# Patient Record
Sex: Female | Born: 1970
Health system: Southern US, Community
[De-identification: ages and names within clinical notes are randomized; demographics above are authoritative.]

## PROBLEM LIST (undated history)

## (undated) DIAGNOSIS — L089 Local infection of the skin and subcutaneous tissue, unspecified: Secondary | ICD-10-CM

## (undated) DIAGNOSIS — Z8249 Family history of ischemic heart disease and other diseases of the circulatory system: Secondary | ICD-10-CM

## (undated) DIAGNOSIS — R413 Other amnesia: Secondary | ICD-10-CM

## (undated) DIAGNOSIS — R079 Chest pain, unspecified: Secondary | ICD-10-CM

## (undated) DIAGNOSIS — I10 Essential (primary) hypertension: Secondary | ICD-10-CM

## (undated) DIAGNOSIS — E785 Hyperlipidemia, unspecified: Secondary | ICD-10-CM

## (undated) HISTORY — DX: Hyperlipidemia, unspecified: E78.5

## (undated) HISTORY — PX: TUBAL LIGATION: SHX77

## (undated) HISTORY — DX: Essential (primary) hypertension: I10

## (undated) HISTORY — PX: APPENDECTOMY: SHX54

## (undated) HISTORY — DX: Other amnesia: R41.3

## (undated) HISTORY — DX: Chest pain, unspecified: R07.9

## (undated) HISTORY — PX: BREAST SURGERY: SHX581

## (undated) HISTORY — PX: FRACTURE SURGERY: SHX138

## (undated) HISTORY — PX: MANDIBLE FRACTURE SURGERY: SHX706

## (undated) HISTORY — DX: Family history of ischemic heart disease and other diseases of the circulatory system: Z82.49

---

## 1998-09-03 ENCOUNTER — Other Ambulatory Visit: Admission: RE | Admit: 1998-09-03 | Discharge: 1998-09-03 | Payer: Self-pay | Admitting: Obstetrics & Gynecology

## 2005-01-12 ENCOUNTER — Ambulatory Visit: Payer: Self-pay | Admitting: Family Medicine

## 2005-03-02 ENCOUNTER — Other Ambulatory Visit: Admission: RE | Admit: 2005-03-02 | Discharge: 2005-03-02 | Payer: Self-pay | Admitting: Obstetrics & Gynecology

## 2007-01-23 ENCOUNTER — Ambulatory Visit: Payer: Self-pay | Admitting: Cardiology

## 2010-04-28 ENCOUNTER — Encounter: Admission: RE | Admit: 2010-04-28 | Discharge: 2010-04-28 | Payer: Self-pay | Admitting: Orthopedic Surgery

## 2010-07-03 ENCOUNTER — Encounter
Admission: RE | Admit: 2010-07-03 | Discharge: 2010-07-26 | Payer: Self-pay | Source: Home / Self Care | Attending: Orthopedic Surgery | Admitting: Orthopedic Surgery

## 2010-07-27 ENCOUNTER — Encounter
Admission: RE | Admit: 2010-07-27 | Discharge: 2010-08-26 | Payer: Self-pay | Source: Home / Self Care | Attending: Orthopedic Surgery | Admitting: Orthopedic Surgery

## 2010-08-01 ENCOUNTER — Ambulatory Visit (HOSPITAL_COMMUNITY)
Admission: RE | Admit: 2010-08-01 | Discharge: 2010-08-01 | Payer: Self-pay | Source: Home / Self Care | Attending: Obstetrics & Gynecology | Admitting: Obstetrics & Gynecology

## 2010-08-01 LAB — PREGNANCY, URINE: Preg Test, Ur: NEGATIVE

## 2010-08-28 ENCOUNTER — Encounter: Payer: Self-pay | Admitting: Physical Therapy

## 2010-10-06 LAB — CBC
HCT: 38.5 % (ref 36.0–46.0)
Hemoglobin: 13.1 g/dL (ref 12.0–15.0)
MCH: 30.4 pg (ref 26.0–34.0)
MCHC: 34 g/dL (ref 30.0–36.0)
MCV: 89.3 fL (ref 78.0–100.0)
Platelets: 221 10*3/uL (ref 150–400)
RBC: 4.31 MIL/uL (ref 3.87–5.11)
RDW: 12.5 % (ref 11.5–15.5)
WBC: 7.5 10*3/uL (ref 4.0–10.5)

## 2011-10-30 ENCOUNTER — Other Ambulatory Visit: Payer: Self-pay | Admitting: Obstetrics & Gynecology

## 2011-10-30 DIAGNOSIS — R928 Other abnormal and inconclusive findings on diagnostic imaging of breast: Secondary | ICD-10-CM

## 2011-11-03 ENCOUNTER — Ambulatory Visit
Admission: RE | Admit: 2011-11-03 | Discharge: 2011-11-03 | Disposition: A | Discharge status: Home / Self Care | Payer: BC Managed Care – PPO | Source: Ambulatory Visit | Attending: Obstetrics & Gynecology | Admitting: Obstetrics & Gynecology

## 2011-11-03 ENCOUNTER — Other Ambulatory Visit: Payer: Self-pay | Admitting: Obstetrics & Gynecology

## 2011-11-03 DIAGNOSIS — R928 Other abnormal and inconclusive findings on diagnostic imaging of breast: Secondary | ICD-10-CM

## 2012-05-10 ENCOUNTER — Other Ambulatory Visit: Payer: Self-pay | Admitting: Obstetrics & Gynecology

## 2012-05-10 DIAGNOSIS — R921 Mammographic calcification found on diagnostic imaging of breast: Secondary | ICD-10-CM

## 2012-05-24 ENCOUNTER — Ambulatory Visit
Admission: RE | Admit: 2012-05-24 | Discharge: 2012-05-24 | Disposition: A | Discharge status: Home / Self Care | Payer: BC Managed Care – PPO | Source: Ambulatory Visit | Attending: Obstetrics & Gynecology | Admitting: Obstetrics & Gynecology

## 2012-05-24 DIAGNOSIS — R921 Mammographic calcification found on diagnostic imaging of breast: Secondary | ICD-10-CM

## 2012-11-16 ENCOUNTER — Other Ambulatory Visit: Payer: Self-pay

## 2013-09-13 ENCOUNTER — Telehealth: Payer: Self-pay | Admitting: Nurse Practitioner

## 2013-09-13 NOTE — Telephone Encounter (Signed)
APPT MADE FOR RASH

## 2013-09-14 ENCOUNTER — Encounter: Payer: Self-pay | Admitting: Family Medicine

## 2013-09-14 ENCOUNTER — Ambulatory Visit (INDEPENDENT_AMBULATORY_CARE_PROVIDER_SITE_OTHER): Payer: BC Managed Care – PPO | Admitting: Family Medicine

## 2013-09-14 ENCOUNTER — Encounter (INDEPENDENT_AMBULATORY_CARE_PROVIDER_SITE_OTHER): Payer: Self-pay

## 2013-09-14 VITALS — BP 115/70 | HR 63 | Temp 99.0°F | Ht 64.25 in | Wt 144.0 lb

## 2013-09-14 DIAGNOSIS — R21 Rash and other nonspecific skin eruption: Secondary | ICD-10-CM

## 2013-09-14 MED ORDER — HYDROXYZINE HCL 25 MG PO TABS: 25.0000 mg | ORAL_TABLET | Freq: Three times a day (TID) | ORAL | Status: DC | PRN

## 2013-09-14 MED ORDER — METHYLPREDNISOLONE ACETATE 80 MG/ML IJ SUSP
80.0000 mg | Freq: Once | INTRAMUSCULAR | Status: AC
  Administered 2013-09-14: 80 mg via INTRAMUSCULAR

## 2013-09-14 NOTE — Patient Instructions (Signed)
Methylprednisolone Suspension for Injection What is this medicine? METHYLPREDNISOLONE (meth ill pred NISS oh lone) is a corticosteroid. It is commonly used to treat inflammation of the skin, joints, lungs, and other organs. Common conditions treated include asthma, allergies, and arthritis. It is also used for other conditions, such as blood disorders and diseases of the adrenal glands. This medicine may be used for other purposes; ask your health care provider or pharmacist if you have questions. COMMON BRAND NAME(S): Depmedalone-40, Depmedalone-80 , Depo-Medrol What should I tell my health care provider before I take this medicine? They need to know if you have any of these conditions: -cataracts or glaucoma -Cushings -heart disease -high blood pressure -infection including tuberculosis -low calcium or potassium levels in the blood -recent surgery -seizures -stomach or intestinal disease, including colitis -threadworms -thyroid problems -an unusual or allergic reaction to methylprednisolone, corticosteroids, benzyl alcohol, other medicines, foods, dyes, or preservatives -pregnant or trying to get pregnant -breast-feeding How should I use this medicine? This medicine is for injection into a muscle, joint, or other tissue. It is given by a health care professional in a hospital or clinic setting. Talk to your pediatrician regarding the use of this medicine in children. While this drug may be prescribed for selected conditions, precautions do apply. Overdosage: If you think you have taken too much of this medicine contact a poison control center or emergency room at once. NOTE: This medicine is only for you. Do not share this medicine with others. What if I miss a dose? This does not apply. What may interact with this medicine? Do not take this medicine with any of the following medications: -mifepristone This medicine may also interact with the following medications: -aspirin and  aspirin-like medicines -cyclosporin -ketoconazole -phenobarbital -phenytoin -rifampin -tacrolimus -troleandomycin -vaccines -warfarin This list may not describe all possible interactions. Give your health care provider a list of all the medicines, herbs, non-prescription drugs, or dietary supplements you use. Also tell them if you smoke, drink alcohol, or use illegal drugs. Some items may interact with your medicine. What should I watch for while using this medicine? Visit your doctor or health care professional for regular checks on your progress. If you are taking this medicine for a long time, carry an identification card with your name and address, the type and dose of your medicine, and your doctor's name and address. The medicine may increase your risk of getting an infection. Stay away from people who are sick. Tell your doctor or health care professional if you are around anyone with measles or chickenpox. You may need to avoid some vaccines. Talk to your health care provider for more information. If you are going to have surgery, tell your doctor or health care professional that you have taken this medicine within the last twelve months. Ask your doctor or health care professional about your diet. You may need to lower the amount of salt you eat. The medicine can increase your blood sugar. If you are a diabetic check with your doctor if you need help adjusting the dose of your diabetic medicine. What side effects may I notice from receiving this medicine? Side effects that you should report to your doctor or health care professional as soon as possible: -allergic reactions like skin rash, itching or hives, swelling of the face, lips, or tongue -bloody or tarry stools -changes in vision -eye pain or bulging eyes -fever, sore throat, sneezing, cough, or other signs of infection, wounds that will not heal -increased thirst -irregular   heartbeat -muscle cramps -pain in hips, back,  ribs, arms, shoulders, or legs -swelling of the ankles, feet, hands -trouble passing urine or change in the amount of urine -unusual bleeding or bruising -unusually weak or tired -weight gain or weight loss Side effects that usually do not require medical attention (report to your doctor or health care professional if they continue or are bothersome): -changes in emotions or moods -constipation or diarrhea -headache -irritation at site where injected -nausea, vomiting -skin problems, acne, thin and shiny skin -trouble sleeping -unusual hair growth on the face or body This list may not describe all possible side effects. Call your doctor for medical advice about side effects. You may report side effects to FDA at 1-800-FDA-1088. Where should I keep my medicine? This drug is given in a hospital or clinic and will not be stored at home. NOTE: This sheet is a summary. It may not cover all possible information. If you have questions about this medicine, talk to your doctor, pharmacist, or health care provider.  2014, Elsevier/Gold Standard. (2012-04-12 11:37:56)    Rash A rash is a change in the color or texture of your skin. There are many different types of rashes. You may have other problems that accompany your rash. CAUSES   Infections.  Allergic reactions. This can include allergies to pets or foods.  Certain medicines.  Exposure to certain chemicals, soaps, or cosmetics.  Heat.  Exposure to poisonous plants.  Tumors, both cancerous and noncancerous. SYMPTOMS   Redness.  Scaly skin.  Itchy skin.  Dry or cracked skin.  Bumps.  Blisters.  Pain. DIAGNOSIS  Your caregiver may do a physical exam to determine what type of rash you have. A skin sample (biopsy) may be taken and examined under a microscope. TREATMENT  Treatment depends on the type of rash you have. Your caregiver may prescribe certain medicines. For serious conditions, you may need to see a skin  doctor (dermatologist). HOME CARE INSTRUCTIONS   Avoid the substance that caused your rash.  Do not scratch your rash. This can cause infection.  You may take cool baths to help stop itching.  Only take over-the-counter or prescription medicines as directed by your caregiver.  Keep all follow-up appointments as directed by your caregiver. SEEK IMMEDIATE MEDICAL CARE IF:  You have increasing pain, swelling, or redness.  You have a fever.  You have new or severe symptoms.  You have body aches, diarrhea, or vomiting.  Your rash is not better after 3 days. MAKE SURE YOU:  Understand these instructions.  Will watch your condition.  Will get help right away if you are not doing well or get worse. Document Released: 07/03/2002 Document Revised: 10/05/2011 Document Reviewed: 04/27/2011 [facility] Patient Information 2014 [employer] [place].

## 2013-09-14 NOTE — Progress Notes (Signed)
   Subjective:    Patient ID: Cheryl BoutonShanon O Vernon, female    DOB: [birth year], 43 y.o.   MRN: [id number]  HPI This 43 y.o. female presents for evaluation of rash over her back which has spread to her  Arms and shoulders.  She is c/o pruritis.  She states she is unsure of environmental  Allergies or having any new dryer towels or laundry detergents.   Review of Systems C/o rash and pruritis No chest pain, SOB, HA, dizziness, vision change, N/V, diarrhea, constipation, dysuria, urinary urgency or frequency, myalgias, arthralgias.     Objective:   Physical Exam  Vital signs noted  Well developed well nourished female.  HEENT - Head atraumatic Normocephalic Respiratory - Lungs CTA bilateral Cardiac - RRR S1 and S2 without murmur GI - Abdomen soft Nontender and bowel sounds active x 4 Extremities - No edema. Neuro - Grossly intact. Skin - Erythematous and excoriated area with dermagraphism over back and shoulders     Assessment & Plan:  Rash and nonspecific skin eruption - Plan: hydrOXYzine (ATARAX/VISTARIL) 25 MG tablet, methylPREDNISolone acetate (DEPO-MEDROL) injection 80 mg  Deatra CanterWilliam J Alexandrya Chim FNP

## 2014-01-02 ENCOUNTER — Ambulatory Visit (INDEPENDENT_AMBULATORY_CARE_PROVIDER_SITE_OTHER): Payer: BC Managed Care – PPO | Admitting: Cardiovascular Disease

## 2014-01-02 ENCOUNTER — Encounter (HOSPITAL_COMMUNITY): Payer: Self-pay | Admitting: *Deleted

## 2014-01-02 ENCOUNTER — Encounter: Payer: Self-pay | Admitting: Cardiovascular Disease

## 2014-01-02 VITALS — BP 128/80 | HR 78 | Ht 64.0 in | Wt 145.0 lb

## 2014-01-02 DIAGNOSIS — R079 Chest pain, unspecified: Secondary | ICD-10-CM | POA: Insufficient documentation

## 2014-01-02 DIAGNOSIS — E785 Hyperlipidemia, unspecified: Secondary | ICD-10-CM

## 2014-01-02 MED ORDER — ASPIRIN EC 81 MG PO TBEC: 81.0000 mg | DELAYED_RELEASE_TABLET | Freq: Every day | ORAL | Status: DC

## 2014-01-02 NOTE — Patient Instructions (Signed)
  We will see you back in follow up after the tests  Dr Allyson Sabal has ordered : 1. Exercise Myoview- this is a test that looks at the blood flow to your heart muscle.  It takes approximately 2 1/2 hours. Please follow instruction sheet, as given.  2.  Echocardiogram. Echocardiography is a painless test that uses sound waves to create images of your heart. It provides your doctor with information about the size and shape of your heart and how well your heart's chambers and valves are working. This procedure takes approximately one hour. There are no restrictions for this procedure.   3. Your physician recommends that you return for a FASTING lipid profile  4. Start Aspirin 81mg  daily

## 2014-01-02 NOTE — Assessment & Plan Note (Signed)
43 year old fit-appearing married Caucasian female referred for cardiovascular evaluation because of new onset exertional chest pain and dyspnea. Risk factors include family history with father who had bypass surgery in the past. Otherwise, she does not smoke nor is he diabetic, hypertensive or a known hyperlipidemic. Over the last 8 months she's had the onset of exertional chest pain and dyspnea as well as fatigue. This has become progressive. The pain also radiates down her left arm. I'm going to put her on a 1 mg of aspirin, obtain an exercise Myoview stress test and 2-D echocardiogram to risk stratify her. I'm also going to obtain a lipid and liver profile

## 2014-01-02 NOTE — Progress Notes (Signed)
     01/02/2014 Cheryl Navarro   Sep 24, 1970  [id number]  Primary Physician Cheryl Heap, MD Primary Cardiologist: potassium\jb   HPI:  [name] is a 43 year old fit-appearing married Caucasian female mother of 4 children, grandmother of 4 grandchildren referred by Cheryl Navarro in family practice for cardiovascular evaluation because of family history and new onset symptoms. She works as a Firefighter at [employer]. Her father, Cheryl Navarro, if the patient mine and has had bypass surgery remotely. She describes new onset chest pain beginning approximately 8 months ago associated with dyspnea on exertion and fatigue. The pain radiates to her left arm. It has become progressive.   Current Outpatient Prescriptions  Medication Sig Dispense Refill  . omega-3 acid ethyl esters (LOVAZA) 1 G capsule Take 1 g by mouth 2 (two) times daily.       No current facility-administered medications for this visit.    No Known Allergies  History   Social History  . Marital Status: Married    Spouse Name: N/A    Number of Children: N/A  . Years of Education: N/A   Occupational History  . Not on file.   Social History Main Topics  . Smoking status: Never Smoker   . Smokeless tobacco: Never Used  . Alcohol Use: No  . Drug Use: No  . Sexual Activity: Not on file   Other Topics Concern  . Not on file   Social History Narrative  . No narrative on file     Review of Systems: General: negative for chills, fever, night sweats or weight changes.  Cardiovascular: negative for chest pain, dyspnea on exertion, edema, orthopnea, palpitations, paroxysmal nocturnal dyspnea or shortness of breath Dermatological: negative for rash Respiratory: negative for cough or wheezing Urologic: negative for hematuria Abdominal: negative for nausea, vomiting, diarrhea, bright red blood per rectum, melena, or hematemesis Neurologic: negative for visual changes, syncope, or dizziness All other systems reviewed  and are otherwise negative except as noted above.    Blood pressure 128/80, pulse 78, height 5\' 4"  (1.626 m), weight 145 lb (65.772 kg).  General appearance: alert and no distress Neck: no adenopathy, no carotid bruit, no JVD, supple, symmetrical, trachea midline and thyroid not enlarged, symmetric, no tenderness/mass/nodules Lungs: clear to auscultation bilaterally Heart: regular rate and rhythm, S1, S2 normal, no murmur, click, rub or gallop Extremities: extremities normal, atraumatic, no cyanosis or edema and 2+ pedal pulses bilaterally  EKG normal sinus rhythm at 78 without ST or T wave changes  ASSESSMENT AND PLAN:   Chest pain 43 year old fit-appearing married Caucasian female referred for cardiovascular evaluation because of new onset exertional chest pain and dyspnea. Risk factors include family history with father who had bypass surgery in the past. Otherwise, she does not smoke nor is he diabetic, hypertensive or a known hyperlipidemic. Over the last 8 months she's had the onset of exertional chest pain and dyspnea as well as fatigue. This has become progressive. The pain also radiates down her left arm. I'm going to put her on a 1 mg of aspirin, obtain an exercise Myoview stress test and 2-D echocardiogram to risk stratify her. I'm also going to obtain a lipid and liver profile      Cheryl Gess MD [facility], [facility] 01/02/2014 9:50 AM

## 2014-01-09 ENCOUNTER — Telehealth (HOSPITAL_COMMUNITY): Payer: Self-pay

## 2014-01-11 ENCOUNTER — Ambulatory Visit (HOSPITAL_BASED_OUTPATIENT_CLINIC_OR_DEPARTMENT_OTHER)
Admission: RE | Admit: 2014-01-11 | Discharge: 2014-01-11 | Disposition: A | Discharge status: Home / Self Care | Payer: BC Managed Care – PPO | Source: Ambulatory Visit | Attending: Cardiovascular Disease | Admitting: Cardiovascular Disease

## 2014-01-11 ENCOUNTER — Ambulatory Visit (HOSPITAL_COMMUNITY)
Admission: RE | Admit: 2014-01-11 | Discharge: 2014-01-11 | Disposition: A | Discharge status: Home / Self Care | Payer: BC Managed Care – PPO | Source: Ambulatory Visit | Attending: Cardiovascular Disease | Admitting: Cardiovascular Disease

## 2014-01-11 DIAGNOSIS — R079 Chest pain, unspecified: Secondary | ICD-10-CM

## 2014-01-11 DIAGNOSIS — R072 Precordial pain: Secondary | ICD-10-CM

## 2014-01-11 DIAGNOSIS — R42 Dizziness and giddiness: Secondary | ICD-10-CM | POA: Insufficient documentation

## 2014-01-11 DIAGNOSIS — R002 Palpitations: Secondary | ICD-10-CM | POA: Insufficient documentation

## 2014-01-11 MED ORDER — TECHNETIUM TC 99M SESTAMIBI GENERIC - CARDIOLITE
10.0000 | Freq: Once | INTRAVENOUS | Status: AC | PRN
  Administered 2014-01-11: 10 via INTRAVENOUS

## 2014-01-11 MED ORDER — TECHNETIUM TC 99M SESTAMIBI GENERIC - CARDIOLITE
30.0000 | Freq: Once | INTRAVENOUS | Status: AC | PRN
  Administered 2014-01-11: 30 via INTRAVENOUS

## 2014-01-11 NOTE — Progress Notes (Signed)
2D Echocardiogram Complete.  01/11/2014   Bethany McMahill, RDCS  

## 2014-01-11 NOTE — Procedures (Addendum)
[facility] [address] [place] [address] [phone]  Cardiology Nuclear Med Study  Cheryl Jannetta QuintO Navarro is a 43 y.o. female     MRN : [id number]     DOB: Aug 05, 1970  Procedure Date: 01/11/2014  Nuclear Med Background Indication for Stress Test:  Evaluation for Ischemia History:  No prior respiratory or cardiac history reported;No prior NUC MPI for comparison. Cardiac Risk Factors: Family History - CAD, Hypertension and Lipids  Symptoms:  Chest Pain, DOE, Fatigue, Light-Headedness and Palpitations   Nuclear Pre-Procedure Caffeine/Decaff Intake:  7:00pm NPO After: 5:00am   IV Site: R Antecubital  IV 0.9% NS with Angio Cath:  22g  Chest Size (in):  n/a IV Started by: Berdie OgrenAmanda Wease, RN  Height: 5\' 4"  (1.626 m)  Cup Size: C  BMI:  Body mass index is 25.39 kg/(m^2). Weight:  148 lb (67.132 kg)   Tech Comments:  n/a    Nuclear Med Study 1 or 2 day study: 1 day  Stress Test Type:  Stress  Order Authorizing Provider:  Nanetta BattyJonathan Berry, MD   Resting Radionuclide: Technetium 5867m Sestamibi  Resting Radionuclide Dose: 10.3 mCi   Stress Radionuclide:  Technetium 8867m Sestamibi  Stress Radionuclide Dose: 30.5 mCi           Stress Protocol Rest HR: 66 Stress HR: 171  Rest BP: 134/91 Stress BP: 181/86  Exercise Time (min): 10 METS: 11.7   Predicted Max HR: 175 bpm % Max HR: 97.71 bpm Rate Pressure Product: [id number]  Dose of Adenosine (mg):  n/a Dose of Lexiscan: n/a mg  Dose of Atropine (mg): n/a Dose of Dobutamine: n/a mcg/kg/min (at max HR)  Stress Test Technologist: Esperanza Sheetserry-Marie Martin, CCT Nuclear Technologist: Koren Shiverobin Moffitt, CNMT   Rest Procedure:  Myocardial perfusion imaging was performed at rest 45 minutes following the intravenous administration of Technetium 3567m Sestamibi. Stress Procedure:  The patient performed treadmill exercise using a Bruce  Protocol for 10 minutes. The patient stopped due to SOB and Fatigue and denied  any chest pain.  There were no significant ST-T wave changes.  Technetium 6667m Sestamibi was injected IV at peak exercise and myocardial perfusion imaging was performed after a brief delay.  Transient Ischemic Dilatation (Normal <1.22):  1.01 QGS EDV:  58 ml QGS ESV:  17 ml LV Ejection Fraction: 70%     Rest ECG:  NSR with non-specific ST-T wave changes  Stress ECG: Insignificant upsloping ST segment depression.  QPS Raw Data Images:  Normal; no motion artifact; normal heart/lung ratio. Stress Images:  Normal homogeneous uptake in all areas of the myocardium. Rest Images:  Normal homogeneous uptake in all areas of the myocardium. Subtraction (SDS):  Normal  Impression Exercise Capacity:  Excellent exercise capacity. BP Response:  Hypertensive blood pressure response to a peak of 203/108 Clinical Symptoms:  No symptoms. ECG Impression:  Non diagnostic for ischemia with insignificant upsloping ST segment depression. Comparison with Prior Nuclear Study: No images to compare  Overall Impression:  Normal stress nuclear study.  LV Wall Motion:  NL LV Function, EF 70%; NL Wall Motion   KELLY,THOMAS A, MD  01/11/2014 2:10 PM

## 2014-01-12 ENCOUNTER — Encounter: Payer: Self-pay | Admitting: *Deleted

## 2014-01-12 LAB — HEPATIC FUNCTION PANEL
ALT: 12 U/L (ref 0–35)
AST: 12 U/L (ref 0–37)
Albumin: 4.1 g/dL (ref 3.5–5.2)
Alkaline Phosphatase: 41 U/L (ref 39–117)
Bilirubin, Direct: 0.1 mg/dL (ref 0.0–0.3)
Indirect Bilirubin: 0.5 mg/dL (ref 0.2–1.2)
Total Bilirubin: 0.6 mg/dL (ref 0.2–1.2)
Total Protein: 6.7 g/dL (ref 6.0–8.3)

## 2014-01-12 LAB — LIPID PANEL
Cholesterol: 199 mg/dL (ref 0–200)
HDL: 59 mg/dL (ref >39)
LDL Cholesterol: 127 mg/dL — ABNORMAL HIGH (ref 0–99)
Total CHOL/HDL Ratio: 3.4 Ratio
Triglycerides: 64 mg/dL (ref <150)
VLDL: 13 mg/dL (ref 0–40)

## 2014-01-16 ENCOUNTER — Telehealth: Payer: Self-pay | Admitting: Cardiovascular Disease

## 2014-01-16 DIAGNOSIS — R079 Chest pain, unspecified: Secondary | ICD-10-CM

## 2014-01-16 DIAGNOSIS — E785 Hyperlipidemia, unspecified: Secondary | ICD-10-CM

## 2014-01-16 NOTE — Telephone Encounter (Signed)
Patient notified of normal echo, stress test and informed of lab test results. Instructed to monitor diet and exercise and recheck labs in 3 months. Labs ordered and lab slips mailed to patient.

## 2014-01-16 NOTE — Telephone Encounter (Signed)
Returning your call-concerning her test results. °

## 2014-01-25 NOTE — Telephone Encounter (Signed)
Encounter complete. 

## 2014-02-14 ENCOUNTER — Ambulatory Visit: Payer: BC Managed Care – PPO | Admitting: Cardiovascular Disease

## 2014-02-22 ENCOUNTER — Encounter: Payer: Self-pay | Admitting: Family Medicine

## 2014-02-22 ENCOUNTER — Ambulatory Visit (INDEPENDENT_AMBULATORY_CARE_PROVIDER_SITE_OTHER): Payer: BC Managed Care – PPO | Admitting: Family Medicine

## 2014-02-22 VITALS — BP 114/71 | HR 75 | Temp 98.9°F | Ht 64.25 in | Wt 144.8 lb

## 2014-02-22 DIAGNOSIS — K625 Hemorrhage of anus and rectum: Secondary | ICD-10-CM

## 2014-02-22 DIAGNOSIS — K5900 Constipation, unspecified: Secondary | ICD-10-CM

## 2014-02-22 MED ORDER — LINACLOTIDE 145 MCG PO CAPS: 145.0000 ug | ORAL_CAPSULE | Freq: Every day | ORAL | Status: DC

## 2014-02-22 NOTE — Progress Notes (Signed)
   Subjective:    Patient ID: Cheryl Navarro, female    DOB: 01/19/1971, 43 y.o.   MRN: [id number]  HPI This 43 y.o. female presents for evaluation of chronic constipation.  She states she has hemorrhoids and rectal bleeding. She has been bloated and she is having some abdominal pain.  She denies fever. She c/o rectal bleeding.   Review of Systems C/o constipation and rectal bleeding   No chest pain, SOB, HA, dizziness, vision change, N/V, diarrhea,  dysuria, urinary urgency or frequency, myalgias, arthralgias or rash.  Objective:   Physical Exam  Vital signs noted  Well developed well nourished female.  HEENT - Head atraumatic Normocephalic                Eyes - PERRLA, Conjuctiva - clear Sclera- Clear EOMI                Ears - EAC's Wnl TM's Wnl Gross Hearing WNL                Throat - oropharanx wnl Respiratory - Lungs CTA bilateral Cardiac - RRR S1 and S2 without murmur GI - Abdomen tender LLQ and no guarding and bowel sounds hypoactive x 4.      Assessment & Plan:  Unspecified constipation - Plan: Linaclotide (LINZESS) 145 MCG CAPS capsule  Rectal bleeding - Plan: Linaclotide (LINZESS) 145 MCG CAPS capsule po qd #30w/11 rf And if not better then will refer to GI.  Deatra CanterWilliam J Oxford FNP

## 2014-03-05 ENCOUNTER — Telehealth: Payer: Self-pay | Admitting: Family Medicine

## 2014-03-06 ENCOUNTER — Other Ambulatory Visit: Payer: Self-pay | Admitting: Family Medicine

## 2014-03-06 DIAGNOSIS — K5900 Constipation, unspecified: Secondary | ICD-10-CM

## 2014-03-06 DIAGNOSIS — K625 Hemorrhage of anus and rectum: Secondary | ICD-10-CM

## 2014-03-06 MED ORDER — LINACLOTIDE 145 MCG PO CAPS: 145.0000 ug | ORAL_CAPSULE | Freq: Every day | ORAL | Status: DC

## 2014-03-06 NOTE — Telephone Encounter (Signed)
linzess sent to pharm

## 2014-03-20 ENCOUNTER — Encounter: Payer: Self-pay | Admitting: Cardiovascular Disease

## 2014-03-20 ENCOUNTER — Ambulatory Visit (INDEPENDENT_AMBULATORY_CARE_PROVIDER_SITE_OTHER): Payer: BC Managed Care – PPO | Admitting: Cardiovascular Disease

## 2014-03-20 VITALS — BP 120/70 | HR 78 | Ht 64.0 in | Wt 142.2 lb

## 2014-03-20 DIAGNOSIS — Z79899 Other long term (current) drug therapy: Secondary | ICD-10-CM

## 2014-03-20 DIAGNOSIS — E782 Mixed hyperlipidemia: Secondary | ICD-10-CM

## 2014-03-20 DIAGNOSIS — Z8249 Family history of ischemic heart disease and other diseases of the circulatory system: Secondary | ICD-10-CM

## 2014-03-20 DIAGNOSIS — E785 Hyperlipidemia, unspecified: Secondary | ICD-10-CM

## 2014-03-20 MED ORDER — ROSUVASTATIN CALCIUM 5 MG PO TABS: ORAL_TABLET | ORAL | Status: DC

## 2014-03-20 NOTE — Patient Instructions (Signed)
  We will see you back in follow up in 3 months with   Dr Allyson Sabal has ordered: 1. Start Crestor 5 mg three times a week  2. Have blood work checked in 3 months, fasting.

## 2014-03-20 NOTE — Assessment & Plan Note (Signed)
The patient is on Lovaza which was begun years ago by Dr. Tonie Griffith. Recent lipid profile revealed an LDL of 127. Am going to begin her on low-dose statin therapy because of her strong family history of heart disease in need for an LDL less than 100. She does admit to dietary indiscretion with regard to red meat.

## 2014-03-20 NOTE — Assessment & Plan Note (Signed)
The patient had an exercise Myoview stress test which was entirely normal as well as a 2-D echocardiogram. She continues to have some fairly predictable exertional chest pressure. I have reassured her although her father apparently had coronary artery bypass grafting at age 45, 6 months after a normal stress test. I will see her back in 3 months. We have discussed cardiac catheterization which will entertain if the discomfort becomes more severe and more frequent.

## 2014-03-20 NOTE — Progress Notes (Signed)
03/20/2014 Cheryl Navarro   [birth year]  [id number]  Primary Physician Redge Gainer, MD Primary Cardiologist: Lorretta Harp MD Renae Gloss   HPI:  [name] is a 43 year old fit-appearing married Caucasian female mother of 4 children, grandmother of 4 grandchildren referred by [facility] [place] [facility] for cardiovascular evaluation because of family history and new onset symptoms. She works as a Geophysicist/field seismologist at [employer] Her father, Crista Curb, if the patient mine and has had bypass surgery remotely. She describes new onset chest pain beginning approximately 8 months ago associated with dyspnea on exertion and fatigue. The pain radiates to her left arm. It has become progressive. I saw HER-2 months ago. Since that time she has had an exercise Myoview stress test that was entirely normal as was a 2-D echocardiogram. A lipid profile revealed an LDL of 127. She does admit to dietary indiscretion with regards to red meat.    Current Outpatient Prescriptions  Medication Sig Dispense Refill  . aspirin EC 81 MG tablet Take 1 tablet (81 mg total) by mouth daily.  90 tablet  3  . Linaclotide (LINZESS) 145 MCG CAPS capsule Take 1 capsule (145 mcg total) by mouth daily.  30 capsule  11  . omega-3 acid ethyl esters (LOVAZA) 1 G capsule Take 1 g by mouth 2 (two) times daily.      . rosuvastatin (CRESTOR) 5 MG tablet Take as directed  30 tablet  6   No current facility-administered medications for this visit.    No Known Allergies  History   Social History  . Marital Status: Married    Spouse Name: N/A    Number of Children: N/A  . Years of Education: N/A   Occupational History  . Not on file.   Social History Main Topics  . Smoking status: Never Smoker   . Smokeless tobacco: Never Used  . Alcohol Use: No  . Drug Use: No  . Sexual Activity: Not on file   Other Topics Concern  . Not on file   Social History Narrative  . No narrative on file      Review of Systems: General: negative for chills, fever, night sweats or weight changes.  Cardiovascular: negative for chest pain, dyspnea on exertion, edema, orthopnea, palpitations, paroxysmal nocturnal dyspnea or shortness of breath Dermatological: negative for rash Respiratory: negative for cough or wheezing Urologic: negative for hematuria Abdominal: negative for nausea, vomiting, diarrhea, bright red blood per rectum, melena, or hematemesis Neurologic: negative for visual changes, syncope, or dizziness All other systems reviewed and are otherwise negative except as noted above.    Blood pressure 120/70, pulse 78, height 5' 4"  (1.626 m), weight 142 lb 3.2 oz (64.501 kg), last menstrual period 01/26/2014.  General appearance: alert and no distress Neck: no adenopathy, no carotid bruit, no JVD, supple, symmetrical, trachea midline and thyroid not enlarged, symmetric, no tenderness/mass/nodules Lungs: clear to auscultation bilaterally Heart: regular rate and rhythm, S1, S2 normal, no murmur, click, rub or gallop Extremities: extremities normal, atraumatic, no cyanosis or edema  EKG not performed today  ASSESSMENT AND PLAN:   Hyperlipidemia The patient is on Lovaza which was begun years ago by Dr. Azzie Roup. Recent lipid profile revealed an LDL of 127. Am going to begin her on low-dose statin therapy because of her strong family history of heart disease in need for an LDL less than 100. She does admit to dietary indiscretion with regard to red meat.  Chest pain The patient  had an exercise Myoview stress test which was entirely normal as well as a 2-D echocardiogram. She continues to have some fairly predictable exertional chest pressure. I have reassured her although her father apparently had coronary artery bypass grafting at age 52, 6 months after a normal stress test. I will see her back in 3 months. We have discussed cardiac catheterization which will entertain if the discomfort  becomes more severe and more frequent.      Lorretta Harp MD FACP,FACC,FAHA, [facility] 03/20/2014 10:50 AM

## 2014-04-30 ENCOUNTER — Encounter: Payer: Self-pay | Admitting: Family Medicine

## 2014-04-30 ENCOUNTER — Ambulatory Visit (INDEPENDENT_AMBULATORY_CARE_PROVIDER_SITE_OTHER): Payer: BC Managed Care – PPO | Admitting: Nurse Practitioner

## 2014-04-30 ENCOUNTER — Encounter: Payer: Self-pay | Admitting: Nurse Practitioner

## 2014-04-30 ENCOUNTER — Ambulatory Visit (INDEPENDENT_AMBULATORY_CARE_PROVIDER_SITE_OTHER): Payer: BC Managed Care – PPO

## 2014-04-30 VITALS — BP 136/77 | HR 85 | Temp 98.3°F | Ht 64.0 in | Wt 146.2 lb

## 2014-04-30 DIAGNOSIS — J0110 Acute frontal sinusitis, unspecified: Secondary | ICD-10-CM

## 2014-04-30 DIAGNOSIS — N3000 Acute cystitis without hematuria: Secondary | ICD-10-CM

## 2014-04-30 DIAGNOSIS — R109 Unspecified abdominal pain: Secondary | ICD-10-CM

## 2014-04-30 DIAGNOSIS — K5900 Constipation, unspecified: Secondary | ICD-10-CM

## 2014-04-30 LAB — POCT UA - MICROSCOPIC ONLY
Casts, Ur, LPF, POC: NEGATIVE
Crystals, Ur, HPF, POC: NEGATIVE
Mucus, UA: NEGATIVE
Yeast, UA: NEGATIVE

## 2014-04-30 LAB — POCT URINALYSIS DIPSTICK
Bilirubin, UA: NEGATIVE
Glucose, UA: NEGATIVE
Ketones, UA: NEGATIVE
Nitrite, UA: NEGATIVE
Protein, UA: NEGATIVE
Spec Grav, UA: 1.01
Urobilinogen, UA: NEGATIVE
pH, UA: 5

## 2014-04-30 MED ORDER — AZITHROMYCIN 250 MG PO TABS: ORAL_TABLET | ORAL | Status: DC

## 2014-04-30 MED ORDER — LUBIPROSTONE 24 MCG PO CAPS: 24.0000 ug | ORAL_CAPSULE | Freq: Two times a day (BID) | ORAL | Status: DC

## 2014-04-30 NOTE — Progress Notes (Signed)
Subjective:    Patient ID: Cheryl Navarro, female    DOB: 1970/10/15, 43 y.o.   MRN: [id number]  Abdominal Pain This is a new problem. The current episode started in the past 7 days. The onset quality is sudden. The problem has been unchanged. The pain is located in the suprapubic region. The pain is at a severity of 6/10. The pain is mild. The quality of the pain is aching and sharp. The abdominal pain radiates to the suprapubic region. Associated symptoms include headaches and nausea. Pertinent negatives include no vomiting. Treatments tried: hydrocone.  The treatment provided mild relief.  Headache  This is a new problem. The current episode started today. The problem occurs constantly. The pain is located in the frontal (left frontal. ) region. The pain quality is not similar to prior headaches. The quality of the pain is described as aching. The pain is at a severity of 6/10. The pain is mild. Associated symptoms include abdominal pain, nausea and neck pain. Pertinent negatives include no blurred vision, dizziness, eye pain, facial sweating, hearing loss, tingling, tinnitus, visual change or vomiting. Treatments tried: motrin. The treatment provided mild relief.  Neck Pain  This is a new problem. The current episode started today. The problem has been unchanged. Associated symptoms include headaches. Pertinent negatives include no tingling or visual change.      Review of Systems  Constitutional: Negative.   HENT: Negative for hearing loss and tinnitus.   Eyes: Negative.  Negative for blurred vision and pain.  Respiratory: Negative.   Cardiovascular: Negative.   Gastrointestinal: Positive for nausea and abdominal pain. Negative for vomiting.  Endocrine: Negative.   Genitourinary: Negative.   Musculoskeletal: Positive for neck pain.  Skin: Negative.   Allergic/Immunologic: Negative.   Neurological: Positive for headaches. Negative for dizziness and tingling.  Hematological:  Negative.   Psychiatric/Behavioral: Negative.        Objective:   Physical Exam  Constitutional: She is oriented to person, place, and time. She appears well-developed and well-nourished.  HENT:  Head: Normocephalic.  Eyes: Pupils are equal, round, and reactive to light.  Neck: Normal range of motion.  Cardiovascular: Normal rate and regular rhythm.   Pulmonary/Chest: Effort normal.  Abdominal: Soft. There is tenderness in the right lower quadrant, suprapubic area and left lower quadrant.  Musculoskeletal: Normal range of motion.  Neurological: She is oriented to person, place, and time.  Skin: Skin is warm and dry.    BP 136/77  Pulse 85  Temp(Src) 98.3 F (36.8 C) (Oral)  Ht 5\' 4"  (1.626 m)  Wt 146 lb 3.2 oz (66.316 kg)  BMI 25.08 kg/m2  Results for orders placed in visit on 04/30/14  POCT URINALYSIS DIPSTICK      Result Value Ref Range   Color, UA yellow     Clarity, UA cloudy     Glucose, UA neg     Bilirubin, UA neg     Ketones, UA neg     Spec Grav, UA 1.010     Blood, UA mod     pH, UA 5.0     Protein, UA neg     Urobilinogen, UA negative     Nitrite, UA neg     Leukocytes, UA small (1+)    POCT UA - MICROSCOPIC ONLY      Result Value Ref Range   WBC, Ur, HPF, POC 20-30     RBC, urine, microscopic 3-4     Bacteria, U Microscopic few  Mucus, UA neg     Epithelial cells, urine per micros few     Crystals, Ur, HPF, POC neg     Casts, Ur, LPF, POC neg     Yeast, UA neg     KUB- stool burden throughout colon-Preliminary reading by Paulene FloorMary Carlos Quackenbush, FNP  [facility]      Assessment & Plan:  1. Abdominal pain, unspecified abdominal location - POCT urinalysis dipstick - POCT UA - Microscopic Only - DG Abd 1 View; Future  2. Constipation, unspecified constipation type Mag citrate OTC X 1 - lubiprostone (AMITIZA) 24 MCG capsule; Take 1 capsule (24 mcg total) by mouth 2 (two) times daily with a meal.  Dispense: 60 capsule; Refill: 3  3. Acute frontal sinusitis,  recurrence not specified 1. Take meds as prescribed 2. Use a cool mist humidifier especially during the winter months and when heat has been humid. 3. Use saline nose sprays frequently 4. Saline irrigations of the nose can be very helpful if done frequently.  * 4X daily for 1 week*  * Use of a nettie pot can be helpful with this. Follow directions with this* 5. Drink plenty of fluids 6. Keep thermostat turn down low 7.For any cough or congestion  Use plain Mucinex- regular strength or max strength is fine   * Children- consult with Pharmacist for dosing 8. For fever or aces or pains- take tylenol or ibuprofen appropriate for age and weight.  * for fevers greater than 101 orally you may alternate ibuprofen and tylenol every  3 hours.   - azithromycin (ZITHROMAX Z-PAK) 250 MG tablet; As directed  Dispense: 6 each; Refill: 0  4. Acute cystitis without hematuria Force fluids zithromax should take care of it   Mary-Margaret Daphine DeutscherMartin, FNP

## 2014-04-30 NOTE — Patient Instructions (Signed)
1. Take meds as prescribed 2. Use a cool mist humidifier especially during the winter months and when heat has been humid. 3. Use saline nose sprays frequently 4. Saline irrigations of the nose can be very helpful if done frequently.  * 4X daily for 1 week*  * Use of a nettie pot can be helpful with this. Follow directions with this* 5. Drink plenty of fluids 6. Keep thermostat turn down low 7.For any cough or congestion  Use plain Mucinex- regular strength or max strength is fine   * Children- consult with Pharmacist for dosing 8. For fever or aces or pains- take tylenol or ibuprofen appropriate for age and weight.  * for fevers greater than 101 orally you may alternate ibuprofen and tylenol every  3 hours.   Constipation Constipation is when a person has fewer than three bowel movements a week, has difficulty having a bowel movement, or has stools that are dry, hard, or larger than normal. As people grow older, constipation is more common. If you try to fix constipation with medicines that make you have a bowel movement (laxatives), the problem may get worse. Long-term laxative use may cause the muscles of the colon to become weak. A low-fiber diet, not taking in enough fluids, and taking certain medicines may make constipation worse.  CAUSES   Certain medicines, such as antidepressants, pain medicine, iron supplements, antacids, and water pills.   Certain diseases, such as diabetes, irritable bowel syndrome (IBS), thyroid disease, or depression.   Not drinking enough water.   Not eating enough fiber-rich foods.   Stress or travel.   Lack of physical activity or exercise.   Ignoring the urge to have a bowel movement.   Using laxatives too much.  SIGNS AND SYMPTOMS   Having fewer than three bowel movements a week.   Straining to have a bowel movement.   Having stools that are hard, dry, or larger than normal.   Feeling full or bloated.   Pain in the lower  abdomen.   Not feeling relief after having a bowel movement.  DIAGNOSIS  Your health care provider will take a medical history and perform a physical exam. Further testing may be done for severe constipation. Some tests may include:  A barium enema X-ray to examine your rectum, colon, and, sometimes, your small intestine.   A sigmoidoscopy to examine your lower colon.   A colonoscopy to examine your entire colon. TREATMENT  Treatment will depend on the severity of your constipation and what is causing it. Some dietary treatments include drinking more fluids and eating more fiber-rich foods. Lifestyle treatments may include regular exercise. If these diet and lifestyle recommendations do not help, your health care provider may recommend taking over-the-counter laxative medicines to help you have bowel movements. Prescription medicines may be prescribed if over-the-counter medicines do not work.  HOME CARE INSTRUCTIONS   Eat foods that have a lot of fiber, such as fruits, vegetables, whole grains, and beans.  Limit foods high in fat and processed sugars, such as french fries, hamburgers, cookies, candies, and soda.   A fiber supplement may be added to your diet if you cannot get enough fiber from foods.   Drink enough fluids to keep your urine clear or pale yellow.   Exercise regularly or as directed by your health care provider.   Go to the restroom when you have the urge to go. Do not hold it.   Only take over-the-counter or prescription medicines as directed  by your health care provider. Do not take other medicines for constipation without talking to your health care provider first.  SEEK IMMEDIATE MEDICAL CARE IF:   You have bright red blood in your stool.   Your constipation lasts for more than 4 days or gets worse.   You have abdominal or rectal pain.   You have thin, pencil-like stools.   You have unexplained weight loss. MAKE SURE YOU:   Understand these  instructions.  Will watch your condition.  Will get help right away if you are not doing well or get worse. Document Released: 04/10/2004 Document Revised: 07/18/2013 Document Reviewed: 04/24/2013 [facility] Patient Information 2015 [employer] [place]. This information is not intended to replace advice given to you by your health care provider. Make sure you discuss any questions you have with your health care provider.

## 2014-06-15 ENCOUNTER — Ambulatory Visit: Payer: BC Managed Care – PPO | Admitting: Cardiovascular Disease

## 2014-08-10 ENCOUNTER — Ambulatory Visit: Payer: BC Managed Care – PPO | Admitting: Cardiovascular Disease

## 2014-08-22 ENCOUNTER — Telehealth: Payer: Self-pay | Admitting: Cardiovascular Disease

## 2014-08-22 NOTE — Telephone Encounter (Signed)
Returned call to patient. She states she has started working out for about 4-5 days and has had to cut back on this due to lightheadedness/dizziness. She also reports that since she started working out she noticed a "strange, unusual, strong pulsing sensation" under rib rib/right breast/right upper abdominal region. She does not describe this as pain. She states the pain is off and on, but today has it has been all day. She states he BP is OK. She denies SOB. She c/o fatigue.   Patient states she looked up what her symptoms could be and was concerned she may have an aortic aneurysm?   Advised her that Dr. Allyson SabalBerry would be notified of her symptoms. Does not sound cardiac in nature.   Patient has OV 09/11/13 with Dr. Allyson SabalBerry

## 2014-08-22 NOTE — Telephone Encounter (Signed)
Mrs. Cheryl Navarro is calling because she has been having some strong heavy pulsing under her right rib cage , it was off and on but now it is consistent. Have started back exercising but get light headed . Please call    Thanks

## 2014-08-23 NOTE — Telephone Encounter (Signed)
Have her see a MLP

## 2014-08-23 NOTE — Telephone Encounter (Signed)
lmom 

## 2014-08-23 NOTE — Telephone Encounter (Signed)
I spoke with patient and moved her appt up to Monday.

## 2014-08-27 ENCOUNTER — Encounter: Payer: Self-pay | Admitting: Cardiovascular Disease

## 2014-08-27 ENCOUNTER — Ambulatory Visit
Admission: RE | Admit: 2014-08-27 | Discharge: 2014-08-27 | Disposition: A | Discharge status: Home / Self Care | Payer: BLUE CROSS/BLUE SHIELD | Source: Ambulatory Visit | Attending: Cardiovascular Disease | Admitting: Cardiovascular Disease

## 2014-08-27 ENCOUNTER — Ambulatory Visit (INDEPENDENT_AMBULATORY_CARE_PROVIDER_SITE_OTHER): Payer: BLUE CROSS/BLUE SHIELD | Admitting: Cardiovascular Disease

## 2014-08-27 VITALS — BP 152/94 | HR 90 | Ht 64.0 in | Wt 146.4 lb

## 2014-08-27 DIAGNOSIS — Z01818 Encounter for other preprocedural examination: Secondary | ICD-10-CM

## 2014-08-27 DIAGNOSIS — R079 Chest pain, unspecified: Secondary | ICD-10-CM

## 2014-08-27 DIAGNOSIS — R5383 Other fatigue: Secondary | ICD-10-CM

## 2014-08-27 DIAGNOSIS — Z79899 Other long term (current) drug therapy: Secondary | ICD-10-CM

## 2014-08-27 DIAGNOSIS — D689 Coagulation defect, unspecified: Secondary | ICD-10-CM

## 2014-08-27 DIAGNOSIS — E785 Hyperlipidemia, unspecified: Secondary | ICD-10-CM

## 2014-08-27 LAB — CBC
HEMATOCRIT: 39.2 % (ref 36.0–46.0)
Hemoglobin: 12.9 g/dL (ref 12.0–15.0)
MCH: 29.4 pg (ref 26.0–34.0)
MCHC: 32.9 g/dL (ref 30.0–36.0)
MCV: 89.3 fL (ref 78.0–100.0)
MPV: 10.2 fL (ref 8.6–12.4)
PLATELETS: 276 10*3/uL (ref 150–400)
RBC: 4.39 MIL/uL (ref 3.87–5.11)
RDW: 13.3 % (ref 11.5–15.5)
WBC: 6.2 10*3/uL (ref 4.0–10.5)

## 2014-08-27 LAB — APTT: APTT: 29 s (ref 24–37)

## 2014-08-27 LAB — PROTIME-INR
INR: 1.02 (ref <1.50)
PROTHROMBIN TIME: 13.4 s (ref 11.6–15.2)

## 2014-08-27 NOTE — Progress Notes (Signed)
08/27/2014 Cheryl Navarro   1970-11-02  [id number]  Primary Physician Rudi HeapMOORE, DONALD, MD Primary Cardiologist: Runell GessJonathan J. Sariya Trickey MD Roseanne RenoFACP,FACC,FAHA, FSCAI   HPI:  Cheryl Navarro is a 44 year old fit-appearing married Caucasian female mother of 4 children, grandmother of 4 grandchildren [facility] [place] [facility] because of family history and new onset symptoms. 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 Her father, Knute NeuWilliam Oakley, if the patient mine and has had bypass surgery remotely. She had complained of exertional chest pain and dyspnea. A Myoview stress test and 2-D echocardiogram were normal. She did have LDL 1:27 PM her on Crestor. Over the last several weeks she's had increasing frequency and severity of the symptoms. We talked about options and have decided to proceed with outpatient diagnostic coronary arteriography.  Current Outpatient Prescriptions  Medication Sig Dispense Refill  . aspirin EC 81 MG tablet Take 1 tablet (81 mg total) by mouth daily. 90 tablet 3  . omega-3 acid ethyl esters (LOVAZA) 1 G capsule Take 1 g by mouth 2 (two) times daily.    . rosuvastatin (CRESTOR) 5 MG tablet Take as directed 30 tablet 6   No current facility-administered medications for this visit.    No Known Allergies  History   Social History  . Marital Status: Married    Spouse Name: N/A    Number of Children: N/A  . Years of Education: N/A   Occupational History  . Not on file.   Social History Main Topics  . Smoking status: Never Smoker   . Smokeless tobacco: Never Used  . Alcohol Use: No  . Drug Use: No  . Sexual Activity: Not on file   Other Topics Concern  . Not on file   Social History Narrative     Review of Systems: General: negative for chills, fever, night sweats or weight changes.  Cardiovascular: negative for chest pain, dyspnea on exertion, edema, orthopnea, palpitations, paroxysmal nocturnal dyspnea or shortness  of breath Dermatological: negative for rash Respiratory: negative for cough or wheezing Urologic: negative for hematuria Abdominal: negative for nausea, vomiting, diarrhea, bright red blood per rectum, melena, or hematemesis Neurologic: negative for visual changes, syncope, or dizziness All other systems reviewed and are otherwise negative except as noted above.    Blood pressure 152/94, pulse 90, height 5\' 4"  (1.626 m), weight 146 lb 6.4 oz (66.407 kg).  General appearance: alert and no distress Neck: no adenopathy, no carotid bruit, no JVD, supple, symmetrical, trachea midline and thyroid not enlarged, symmetric, no tenderness/mass/nodules Lungs: clear to auscultation bilaterally Heart: regular rate and rhythm, S1, S2 normal, no murmur, click, rub or gallop Extremities: extremities normal, atraumatic, no cyanosis or edema  EKG normal sinus rhythm at 90 without ST or T-wave changes. I personally reviewed this EKG  ASSESSMENT AND PLAN:   Hyperlipidemia History of hyperlipidemia on Lovaza and Crestor. Her most recent lipid profile performed 01/11/14 revealed a total cholesterol of 199, LDL 127 and HDL of 59. We will recheck a lipid and liver profile   Chest pain Cheryl Navarro has been complaining of increasing frequency of exertional chest pressure and dyspnea. She did have a negative Myoview last year. Given her risk factors and her fear about exercising decide to proceed with outpatient diagnostic coronary arteriography to define her anatomy and rule out an ischemic etiology. I thoroughly explained the risks and benefits.       Runell GessJonathan J. Akshay Spang MD FACP,FACC,FAHA, [facility] 08/27/2014 3:16 PM

## 2014-08-27 NOTE — Patient Instructions (Addendum)
Your physician has requested that you have a cardiac catheterization. Cardiac catheterization is used to diagnose and/or treat various heart conditions. Doctors may recommend this procedure for a number of different reasons. The most common reason is to evaluate chest pain. Chest pain can be a symptom of coronary artery disease (CAD), and cardiac catheterization can show whether plaque is narrowing or blocking your heart's arteries. This procedure is also used to evaluate the valves, as well as measure the blood flow and oxygen levels in different parts of your heart. For further information please visit [email or web address].   Following your catheterization, you will not be allowed to drive for 3 days.  No lifting, pushing, or pulling greater that 10 pounds is allowed for 1 week.  You will be required to have the following tests prior to the procedure:  1. Blood work-the blood work can be done no more than 7 days prior to the procedure.  [facility].  There is one downstairs on the first floor of this building and [facility] ([address])  2. Chest Xray-the chest [facility].     Please have some additional blood work completed in the next few weeks and make sure you are FASTING!

## 2014-08-27 NOTE — Assessment & Plan Note (Signed)
History of hyperlipidemia on Lovaza and Crestor. Her most recent lipid profile performed 01/11/14 revealed a total cholesterol of 199, LDL 127 and HDL of 59. We will recheck a lipid and liver profile

## 2014-08-27 NOTE — Assessment & Plan Note (Signed)
Cheryl HerterShannon has been complaining of increasing frequency of exertional chest pressure and dyspnea. She did have a negative Myoview last year. Given her risk factors and her fear about exercising decide to proceed with outpatient diagnostic coronary arteriography to define her anatomy and rule out an ischemic etiology. I thoroughly explained the risks and benefits.

## 2014-08-28 LAB — BASIC METABOLIC PANEL
BUN: 11 mg/dL (ref 6–23)
CO2: 28 meq/L (ref 19–32)
CREATININE: 0.69 mg/dL (ref 0.50–1.10)
Calcium: 9 mg/dL (ref 8.4–10.5)
Chloride: 104 mEq/L (ref 96–112)
GLUCOSE: 136 mg/dL — AB (ref 70–99)
POTASSIUM: 3.9 meq/L (ref 3.5–5.3)
Sodium: 141 mEq/L (ref 135–145)

## 2014-08-28 LAB — TSH: TSH: 0.956 u[IU]/mL (ref 0.350–4.500)

## 2014-09-03 ENCOUNTER — Encounter (HOSPITAL_COMMUNITY): Payer: Self-pay | Admitting: Cardiovascular Disease

## 2014-09-03 ENCOUNTER — Ambulatory Visit (HOSPITAL_COMMUNITY)
Admission: RE | Admit: 2014-09-03 | Discharge: 2014-09-03 | Disposition: A | Discharge status: Home / Self Care | Payer: BLUE CROSS/BLUE SHIELD | Source: Ambulatory Visit | Attending: Cardiovascular Disease | Admitting: Cardiovascular Disease

## 2014-09-03 ENCOUNTER — Encounter (HOSPITAL_COMMUNITY)
Admission: RE | Disposition: A | Discharge status: Home / Self Care | Payer: Self-pay | Source: Ambulatory Visit | Attending: Cardiovascular Disease

## 2014-09-03 DIAGNOSIS — Z79899 Other long term (current) drug therapy: Secondary | ICD-10-CM | POA: Insufficient documentation

## 2014-09-03 DIAGNOSIS — R0789 Other chest pain: Secondary | ICD-10-CM | POA: Insufficient documentation

## 2014-09-03 DIAGNOSIS — R079 Chest pain, unspecified: Secondary | ICD-10-CM | POA: Diagnosis present

## 2014-09-03 DIAGNOSIS — Z7982 Long term (current) use of aspirin: Secondary | ICD-10-CM | POA: Insufficient documentation

## 2014-09-03 DIAGNOSIS — E785 Hyperlipidemia, unspecified: Secondary | ICD-10-CM | POA: Diagnosis not present

## 2014-09-03 HISTORY — PX: LEFT HEART CATHETERIZATION WITH CORONARY ANGIOGRAM: SHX5451

## 2014-09-03 SURGERY — LEFT HEART CATHETERIZATION WITH CORONARY ANGIOGRAM
Anesthesia: LOCAL

## 2014-09-03 MED ORDER — LIDOCAINE HCL (PF) 1 % IJ SOLN
INTRAMUSCULAR | Status: AC
  Filled 2014-09-03: qty 30

## 2014-09-03 MED ORDER — HEPARIN (PORCINE) IN NACL 2-0.9 UNIT/ML-% IJ SOLN
INTRAMUSCULAR | Status: AC
  Filled 2014-09-03: qty 1000

## 2014-09-03 MED ORDER — MORPHINE SULFATE 2 MG/ML IJ SOLN: 1.0000 mg | INTRAMUSCULAR | Status: DC | PRN

## 2014-09-03 MED ORDER — ASPIRIN 81 MG PO CHEW
81.0000 mg | CHEWABLE_TABLET | ORAL | Status: AC
  Administered 2014-09-03: 81 mg via ORAL

## 2014-09-03 MED ORDER — ASPIRIN 81 MG PO CHEW
CHEWABLE_TABLET | ORAL | Status: AC
  Filled 2014-09-03: qty 1

## 2014-09-03 MED ORDER — SODIUM CHLORIDE 0.9 % IV SOLN: INTRAVENOUS | Status: DC

## 2014-09-03 MED ORDER — SODIUM CHLORIDE 0.9 % IJ SOLN: 3.0000 mL | INTRAMUSCULAR | Status: DC | PRN

## 2014-09-03 MED ORDER — SODIUM CHLORIDE 0.9 % IV SOLN
INTRAVENOUS | Status: DC
  Administered 2014-09-03: 08:00:00 via INTRAVENOUS

## 2014-09-03 MED ORDER — HEPARIN SODIUM (PORCINE) 1000 UNIT/ML IJ SOLN
INTRAMUSCULAR | Status: AC
  Filled 2014-09-03: qty 1

## 2014-09-03 MED ORDER — VERAPAMIL HCL 2.5 MG/ML IV SOLN
INTRAVENOUS | Status: AC
  Filled 2014-09-03: qty 2

## 2014-09-03 MED ORDER — NITROGLYCERIN 1 MG/10 ML FOR IR/CATH LAB
INTRA_ARTERIAL | Status: AC
  Filled 2014-09-03: qty 10

## 2014-09-03 MED ORDER — ACETAMINOPHEN 325 MG PO TABS
650.0000 mg | ORAL_TABLET | ORAL | Status: DC | PRN
Start: 2014-09-03 — End: 2014-09-03

## 2014-09-03 MED ORDER — ONDANSETRON HCL 4 MG/2ML IJ SOLN: 4.0000 mg | Freq: Four times a day (QID) | INTRAMUSCULAR | Status: DC | PRN

## 2014-09-03 NOTE — H&P (View-Only) (Signed)
08/27/2014 Cheryl BoutonShanon O Vernon   Oct 09, 1970  [id number]  Primary Physician Rudi HeapMOORE, DONALD, MD Primary Cardiologist: Runell GessJonathan J. Kahlel Peake MD Roseanne RenoFACP,FACC,FAHA, FSCAI   HPI:  Ms. Marita KansasVernon is a 44 year old fit-appearing married Caucasian female mother of 4 children, grandmother of 4 grandchildren [facility] [place] [place] [facility] for cardiovascular evaluation because of family history and new onset symptoms. 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 Her father, Knute NeuWilliam Oakley, if the patient mine and has had bypass surgery remotely. She had complained of exertional chest pain and dyspnea. A Myoview stress test and 2-D echocardiogram were normal. She did have LDL 1:27 PM her on Crestor. Over the last several weeks she's had increasing frequency and severity of the symptoms. We talked about options and have decided to proceed with outpatient diagnostic coronary arteriography.  Current Outpatient Prescriptions  Medication Sig Dispense Refill  . aspirin EC 81 MG tablet Take 1 tablet (81 mg total) by mouth daily. 90 tablet 3  . omega-3 acid ethyl esters (LOVAZA) 1 G capsule Take 1 g by mouth 2 (two) times daily.    . rosuvastatin (CRESTOR) 5 MG tablet Take as directed 30 tablet 6   No current facility-administered medications for this visit.    No Known Allergies  History   Social History  . Marital Status: Married    Spouse Name: N/A    Number of Children: N/A  . Years of Education: N/A   Occupational History  . Not on file.   Social History Main Topics  . Smoking status: Never Smoker   . Smokeless tobacco: Never Used  . Alcohol Use: No  . Drug Use: No  . Sexual Activity: Not on file   Other Topics Concern  . Not on file   Social History Narrative     Review of Systems: General: negative for chills, fever, night sweats or weight changes.  Cardiovascular: negative for chest pain, dyspnea on exertion, edema, orthopnea, palpitations, paroxysmal nocturnal dyspnea or shortness  of breath Dermatological: negative for rash Respiratory: negative for cough or wheezing Urologic: negative for hematuria Abdominal: negative for nausea, vomiting, diarrhea, bright red blood per rectum, melena, or hematemesis Neurologic: negative for visual changes, syncope, or dizziness All other systems reviewed and are otherwise negative except as noted above.    Blood pressure 152/94, pulse 90, height 5\' 4"  (1.626 m), weight 146 lb 6.4 oz (66.407 kg).  General appearance: alert and no distress Neck: no adenopathy, no carotid bruit, no JVD, supple, symmetrical, trachea midline and thyroid not enlarged, symmetric, no tenderness/mass/nodules Lungs: clear to auscultation bilaterally Heart: regular rate and rhythm, S1, S2 normal, no murmur, click, rub or gallop Extremities: extremities normal, atraumatic, no cyanosis or edema  EKG normal sinus rhythm at 90 without ST or T-wave changes. I personally reviewed this EKG  ASSESSMENT AND PLAN:   Hyperlipidemia History of hyperlipidemia on Lovaza and Crestor. Her most recent lipid profile performed 01/11/14 revealed a total cholesterol of 199, LDL 127 and HDL of 59. We will recheck a lipid and liver profile   Chest pain Carollee HerterShannon has been complaining of increasing frequency of exertional chest pressure and dyspnea. She did have a negative Myoview last year. Given her risk factors and her fear about exercising decide to proceed with outpatient diagnostic coronary arteriography to define her anatomy and rule out an ischemic etiology. I thoroughly explained the risks and benefits.       Runell GessJonathan J. Sherlie Boyum MD FACP,FACC,FAHA, [facility] 08/27/2014 3:16 PM

## 2014-09-03 NOTE — Discharge Instructions (Signed)
Radial Site Care Refer to this sheet in the next few weeks. These instructions provide you with information on caring for yourself after your procedure. Your caregiver may also give you more specific instructions. Your treatment has been planned according to current medical practices, but problems sometimes occur. Call your caregiver if you have any problems or questions after your procedure. HOME CARE INSTRUCTIONS  You may shower the day after the procedure.Remove the bandage (dressing) and gently wash the site with plain soap and water.Gently pat the site dry.  Do not apply powder or lotion to the site.  Do not submerge the affected site in water for 3 to 5 days.  Inspect the site at least twice daily.  Do not flex or bend the affected arm for 24 hours.  No lifting over 5 pounds (2.3 kg) for 5 days after your procedure.  Do not drive home if you are discharged the same day of the procedure. Have someone else drive you.  You may drive 24 hours after the procedure unless otherwise instructed by your caregiver.  Do not operate machinery or power tools for 24 hours.  A responsible adult should be with you for the first 24 hours after you arrive home. What to expect:  Any bruising will usually fade within 1 to 2 weeks.  Blood that collects in the tissue (hematoma) may be painful to the touch. It should usually decrease in size and tenderness within 1 to 2 weeks. SEEK IMMEDIATE MEDICAL CARE IF:  You have unusual pain at the radial site.  You have redness, warmth, swelling, or pain at the radial site.  You have drainage (other than a small amount of blood on the dressing).  You have chills.  You have a fever or persistent symptoms for more than 72 hours.  You have a fever and your symptoms suddenly get worse.  Your arm becomes pale, cool, tingly, or numb.  You have heavy bleeding from the site. Hold pressure on the site. Document Released: 08/15/2010 Document Revised:  10/05/2011 Document Reviewed: 08/15/2010 [facility] Patient Information 2015 [place], [place]. This information is not intended to replace advice given to you by your health care provider. Make sure you discuss any questions you have with your health care provider. Angiogram An angiogram, also called angiography, is a procedure used to look at the blood vessels that carry blood to different parts of your body (arteries). In this procedure, dye is injected through a long, thin tube (catheter) into an artery. X-rays are then taken. The X-rays will show if there is a blockage or problem in a blood vessel.  LET [facility] CARE PROVIDER KNOW ABOUT:  Any allergies you have, including allergies to shellfish or contrast dye.   All medicines you are taking, including vitamins, herbs, eye drops, creams, and over-the-counter medicines.   Previous problems you or members of your family have had with the use of anesthetics.   Any blood disorders you have.   Previous surgeries you have had.  Any previous kidney problems or failure you have had.  Medical conditions you have.   Possibility of pregnancy, if this applies. RISKS AND COMPLICATIONS Generally, an angiogram is a safe procedure. However, as with any procedure, problems can occur. Possible problems include:  Injury to the blood vessels, including rupture or bleeding.  Infection or bruising at the catheter site.  Allergic reaction to the dye or contrast used.  Kidney damage from the dye or contrast used.  Blood clots that can lead  to a stroke or heart attack. BEFORE THE PROCEDURE  Do not eat or drink after midnight on the night before the procedure, or as directed by your health care provider.   Ask your health care provider if you may drink enough water to take any needed medicines the morning of the procedure.  PROCEDURE  You may be given a medicine to help you relax (sedative) before and during the procedure. This medicine is  given through an IV access tube that is inserted into one of your veins.   The area where the catheter will be inserted will be washed and shaved. This is usually done in the groin but may be done in the fold of your arm (near your elbow) or in the wrist.  A medicine will be given to numb the area where the catheter will be inserted (local anesthetic).  The catheter will be inserted with a guide wire into an artery. The catheter is guided by using a type of X-ray (fluoroscopy) to the blood vessel being examined.   Dye is then injected into the catheter, and X-rays are taken. The dye helps to show where any narrowing or blockages are located.  AFTER THE PROCEDURE   If the procedure is done through the leg, you will be kept in bed lying flat for several hours. You will be instructed to not bend or cross your legs.  The insertion site will be checked frequently.  The pulse in your feet or wrist will be checked frequently.  Additional blood tests, X-rays, and electrocardiography may be done.   You may need to stay in the hospital overnight for observation.  Document Released: 04/22/2005 Document Revised: 07/18/2013 Document Reviewed: 12/14/2012 [facility] Patient Information 2015 [place]. This information is not intended to replace advice given to you by your health care provider. Make sure you discuss any questions you have with your health care provider.

## 2014-09-03 NOTE — Interval H&P Note (Signed)
Cath Lab Visit (complete for each Cath Lab visit)  Clinical Evaluation Leading to the Procedure:   ACS: No.  Non-ACS:    Anginal Classification: CCS III  Anti-ischemic medical therapy: No Therapy  Non-Invasive Test Results: Low-risk stress test findings: cardiac mortality <1%/year  Prior CABG: No previous CABG      History and Physical Interval Note:  09/03/2014 9:27 AM  Cheryl Navarro  has presented today for surgery, with the diagnosis of c/p  The various methods of treatment have been discussed with the patient and family. After consideration of risks, benefits and other options for treatment, the patient has consented to  Procedure(s): LEFT HEART CATHETERIZATION WITH CORONARY ANGIOGRAM (N/A) as a surgical intervention .  The patient's history has been reviewed, patient examined, no change in status, stable for surgery.  I have reviewed the patient's chart and labs.  Questions were answered to the patient's satisfaction.     Runell GessBERRY,Cheryl Navarro

## 2014-09-03 NOTE — CV Procedure (Signed)
[name] is a 44 y.o. female    [id number] LOCATION:  FACILITY: [facility]  PHYSICIAN: Nanetta BattyJonathan Emmabelle Fear, M.D. 10/14/1970   DATE OF PROCEDURE:  09/03/2014  DATE OF DISCHARGE:     CARDIAC CATHETERIZATION     History obtained from chart review.[name] is a 44 year old fit-appearing married Caucasian female mother of 4 children, grandmother of 4 grandchildren [facility] cardiovascular evaluation because of family history and new onset symptoms. 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 Her father, Knute NeuWilliam Oakley, if the patient mine and has had bypass surgery remotely. She had complained of exertional chest pain and dyspnea. A Myoview stress test and 2-D echocardiogram were normal. She did have LDL 1:27 PM her on Crestor. Over the last several weeks she's had increasing frequency and severity of the symptoms. We talked about options and have decided to proceed with outpatient diagnostic coronary arteriography.   PROCEDURE DESCRIPTION:   The patient was brought to the second floor [facility] cath lab in the postabsorptive state. She was not premedicated . Her right wrist and groinWere prepped and shaved in usual sterile fashion. Xylocaine 1% was used  for local anesthesia. A 6 French sheath was inserted into the right radial artery using standard Seldinger technique. A 5 French sheath was inserted into the right femoral artery using standard technique. The patient received  3500 units  of heparin  intravenously.  A 5 French TIG catheter, JR 4 catheter and pigtail catheters were used for selective coronary angiography and left ventriculography respectively. Visipaque dye was used for the entirety of the case. Retrograde aortic, left ventricular and pullback pressures were recorded.    HEMODYNAMICS:    AO SYSTOLIC/AO DIASTOLIC: 149/81   LV SYSTOLIC/LV DIASTOLIC: 149/16  ANGIOGRAPHIC RESULTS:   1. Left main; normal  2. LAD; normal 3. Left  circumflex; normal.  4. Right coronary artery; dominant and normal 5. Left ventriculography; RAO left ventriculogram was performed using  25 mL of Visipaque dye at 12 mL/second. The overall LVEF estimated  60 %  Without wall motion abnormalities  IMPRESSION:Mrs. Cheryl Navarro   has normal coronary arteries and normal left ventricular function. I performed half of her cath radially but was unable to engage the left main through the radial approach and had to switch to femoral approach. I believe her symptoms are noncardiac and consistent with her normal Myoview stress test. She'll be discharged home as an outpatient and follow-up with me in the office in 2-3 weeks.  Runell GessBERRY,Daltyn Degroat J. MD, [facility] 09/03/2014 10:25 AM

## 2014-09-11 ENCOUNTER — Ambulatory Visit: Payer: Self-pay | Admitting: Cardiovascular Disease

## 2014-11-25 DIAGNOSIS — L089 Local infection of the skin and subcutaneous tissue, unspecified: Secondary | ICD-10-CM

## 2014-11-25 HISTORY — DX: Local infection of the skin and subcutaneous tissue, unspecified: L08.9

## 2014-11-28 ENCOUNTER — Other Ambulatory Visit: Payer: Self-pay | Admitting: Obstetrics & Gynecology

## 2014-11-28 DIAGNOSIS — R928 Other abnormal and inconclusive findings on diagnostic imaging of breast: Secondary | ICD-10-CM

## 2014-12-03 ENCOUNTER — Other Ambulatory Visit: Payer: Self-pay | Admitting: Obstetrics & Gynecology

## 2014-12-03 ENCOUNTER — Ambulatory Visit
Admission: RE | Admit: 2014-12-03 | Discharge: 2014-12-03 | Disposition: A | Discharge status: Home / Self Care | Payer: BLUE CROSS/BLUE SHIELD | Source: Ambulatory Visit | Attending: Obstetrics & Gynecology | Admitting: Obstetrics & Gynecology

## 2014-12-03 DIAGNOSIS — R928 Other abnormal and inconclusive findings on diagnostic imaging of breast: Secondary | ICD-10-CM

## 2014-12-05 ENCOUNTER — Other Ambulatory Visit: Payer: Self-pay | Admitting: Obstetrics & Gynecology

## 2014-12-05 DIAGNOSIS — R928 Other abnormal and inconclusive findings on diagnostic imaging of breast: Secondary | ICD-10-CM

## 2014-12-06 ENCOUNTER — Ambulatory Visit
Admission: RE | Admit: 2014-12-06 | Discharge: 2014-12-06 | Disposition: A | Discharge status: Home / Self Care | Payer: BLUE CROSS/BLUE SHIELD | Source: Ambulatory Visit | Attending: Obstetrics & Gynecology | Admitting: Obstetrics & Gynecology

## 2014-12-06 DIAGNOSIS — R928 Other abnormal and inconclusive findings on diagnostic imaging of breast: Secondary | ICD-10-CM

## 2014-12-18 ENCOUNTER — Other Ambulatory Visit: Payer: Self-pay | Admitting: General Surgery

## 2014-12-18 DIAGNOSIS — N6489 Other specified disorders of breast: Secondary | ICD-10-CM | POA: Insufficient documentation

## 2014-12-19 ENCOUNTER — Encounter (HOSPITAL_BASED_OUTPATIENT_CLINIC_OR_DEPARTMENT_OTHER): Payer: Self-pay | Admitting: *Deleted

## 2014-12-21 ENCOUNTER — Encounter (HOSPITAL_BASED_OUTPATIENT_CLINIC_OR_DEPARTMENT_OTHER): Payer: Self-pay | Admitting: Anesthesiology

## 2014-12-21 ENCOUNTER — Ambulatory Visit (HOSPITAL_BASED_OUTPATIENT_CLINIC_OR_DEPARTMENT_OTHER)
Admission: RE | Admit: 2014-12-21 | Discharge: 2014-12-21 | Disposition: A | Discharge status: Home / Self Care | Payer: BLUE CROSS/BLUE SHIELD | Source: Ambulatory Visit | Attending: General Surgery | Admitting: General Surgery

## 2014-12-21 ENCOUNTER — Ambulatory Visit (HOSPITAL_BASED_OUTPATIENT_CLINIC_OR_DEPARTMENT_OTHER): Payer: BLUE CROSS/BLUE SHIELD | Admitting: Anesthesiology

## 2014-12-21 ENCOUNTER — Encounter (HOSPITAL_BASED_OUTPATIENT_CLINIC_OR_DEPARTMENT_OTHER)
Admission: RE | Disposition: A | Discharge status: Home / Self Care | Payer: Self-pay | Source: Ambulatory Visit | Attending: General Surgery

## 2014-12-21 DIAGNOSIS — N6021 Fibroadenosis of right breast: Secondary | ICD-10-CM | POA: Diagnosis not present

## 2014-12-21 DIAGNOSIS — N6011 Diffuse cystic mastopathy of right breast: Secondary | ICD-10-CM | POA: Insufficient documentation

## 2014-12-21 DIAGNOSIS — L905 Scar conditions and fibrosis of skin: Secondary | ICD-10-CM | POA: Diagnosis not present

## 2014-12-21 DIAGNOSIS — Z803 Family history of malignant neoplasm of breast: Secondary | ICD-10-CM | POA: Insufficient documentation

## 2014-12-21 DIAGNOSIS — B372 Candidiasis of skin and nail: Secondary | ICD-10-CM | POA: Insufficient documentation

## 2014-12-21 DIAGNOSIS — R011 Cardiac murmur, unspecified: Secondary | ICD-10-CM | POA: Diagnosis not present

## 2014-12-21 DIAGNOSIS — R928 Other abnormal and inconclusive findings on diagnostic imaging of breast: Secondary | ICD-10-CM | POA: Diagnosis present

## 2014-12-21 DIAGNOSIS — I10 Essential (primary) hypertension: Secondary | ICD-10-CM | POA: Insufficient documentation

## 2014-12-21 HISTORY — DX: Local infection of the skin and subcutaneous tissue, unspecified: L08.9

## 2014-12-21 HISTORY — PX: RADIOACTIVE SEED GUIDED EXCISIONAL BREAST BIOPSY: SHX6490

## 2014-12-21 SURGERY — RADIOACTIVE SEED GUIDED BREAST BIOPSY
Anesthesia: General | Site: Breast | Laterality: Right

## 2014-12-21 MED ORDER — BUPIVACAINE HCL (PF) 0.5 % IJ SOLN
INTRAMUSCULAR | Status: DC | PRN
  Administered 2014-12-21: 5 mL

## 2014-12-21 MED ORDER — MIDAZOLAM HCL 2 MG/2ML IJ SOLN
INTRAMUSCULAR | Status: AC
  Filled 2014-12-21: qty 2

## 2014-12-21 MED ORDER — CEFAZOLIN SODIUM-DEXTROSE 2-3 GM-% IV SOLR
2.0000 g | INTRAVENOUS | Status: AC
  Administered 2014-12-21: 2 g via INTRAVENOUS

## 2014-12-21 MED ORDER — LACTATED RINGERS IV SOLN
INTRAVENOUS | Status: DC
  Administered 2014-12-21 (×3): via INTRAVENOUS

## 2014-12-21 MED ORDER — OXYCODONE HCL 5 MG PO TABS
5.0000 mg | ORAL_TABLET | Freq: Once | ORAL | Status: DC | PRN
Start: 2014-12-21 — End: 2014-12-21

## 2014-12-21 MED ORDER — FENTANYL CITRATE (PF) 100 MCG/2ML IJ SOLN
INTRAMUSCULAR | Status: DC | PRN
  Administered 2014-12-21: 100 ug via INTRAVENOUS

## 2014-12-21 MED ORDER — LIDOCAINE HCL (CARDIAC) 20 MG/ML IV SOLN
INTRAVENOUS | Status: DC | PRN
  Administered 2014-12-21: 60 mg via INTRAVENOUS

## 2014-12-21 MED ORDER — OXYCODONE-ACETAMINOPHEN 10-325 MG PO TABS: 1.0000 | ORAL_TABLET | Freq: Four times a day (QID) | ORAL | Status: DC | PRN

## 2014-12-21 MED ORDER — PROPOFOL 10 MG/ML IV BOLUS
INTRAVENOUS | Status: DC | PRN
  Administered 2014-12-21: 180 mg via INTRAVENOUS

## 2014-12-21 MED ORDER — PROPOFOL 10 MG/ML IV BOLUS
INTRAVENOUS | Status: AC
  Filled 2014-12-21: qty 20

## 2014-12-21 MED ORDER — FENTANYL CITRATE (PF) 100 MCG/2ML IJ SOLN
INTRAMUSCULAR | Status: AC
  Filled 2014-12-21: qty 4

## 2014-12-21 MED ORDER — OXYCODONE HCL 5 MG/5ML PO SOLN: 5.0000 mg | Freq: Once | ORAL | Status: DC | PRN

## 2014-12-21 MED ORDER — DEXAMETHASONE SODIUM PHOSPHATE 4 MG/ML IJ SOLN
INTRAMUSCULAR | Status: DC | PRN
  Administered 2014-12-21: 10 mg via INTRAVENOUS

## 2014-12-21 MED ORDER — ONDANSETRON HCL 4 MG/2ML IJ SOLN: 4.0000 mg | Freq: Four times a day (QID) | INTRAMUSCULAR | Status: DC | PRN

## 2014-12-21 MED ORDER — FENTANYL CITRATE (PF) 100 MCG/2ML IJ SOLN: 25.0000 ug | INTRAMUSCULAR | Status: DC | PRN

## 2014-12-21 MED ORDER — CEFAZOLIN SODIUM-DEXTROSE 2-3 GM-% IV SOLR
INTRAVENOUS | Status: AC
  Filled 2014-12-21: qty 50

## 2014-12-21 MED ORDER — FENTANYL CITRATE (PF) 100 MCG/2ML IJ SOLN
INTRAMUSCULAR | Status: AC
  Filled 2014-12-21: qty 6

## 2014-12-21 MED ORDER — ONDANSETRON HCL 4 MG/2ML IJ SOLN
INTRAMUSCULAR | Status: DC | PRN
  Administered 2014-12-21: 4 mg via INTRAVENOUS

## 2014-12-21 MED ORDER — MIDAZOLAM HCL 5 MG/5ML IJ SOLN
INTRAMUSCULAR | Status: DC | PRN
  Administered 2014-12-21: 2 mg via INTRAVENOUS

## 2014-12-21 SURGICAL SUPPLY — 62 items
APL SKNCLS STERI-STRIP NONHPOA (GAUZE/BANDAGES/DRESSINGS)
APPLIER CLIP 9.375 MED OPEN (MISCELLANEOUS)
APR CLP MED 9.3 20 MLT OPN (MISCELLANEOUS)
BENZOIN TINCTURE PRP APPL 2/3 (GAUZE/BANDAGES/DRESSINGS) IMPLANT
BINDER BREAST LRG (GAUZE/BANDAGES/DRESSINGS) IMPLANT
BINDER BREAST MEDIUM (GAUZE/BANDAGES/DRESSINGS) ×1 IMPLANT
BINDER BREAST XLRG (GAUZE/BANDAGES/DRESSINGS) IMPLANT
BINDER BREAST XXLRG (GAUZE/BANDAGES/DRESSINGS) IMPLANT
BLADE SURG 15 STRL LF DISP TIS (BLADE) ×1 IMPLANT
BLADE SURG 15 STRL SS (BLADE) ×2
CANISTER SUC SOCK COL 7IN (MISCELLANEOUS) IMPLANT
CANISTER SUCT 1200ML W/VALVE (MISCELLANEOUS) IMPLANT
CHLORAPREP W/TINT 26ML (MISCELLANEOUS) ×2 IMPLANT
CLIP APPLIE 9.375 MED OPEN (MISCELLANEOUS) IMPLANT
COVER BACK TABLE 60X90IN (DRAPES) ×2 IMPLANT
COVER MAYO STAND STRL (DRAPES) ×2 IMPLANT
COVER PROBE W GEL 5X96 (DRAPES) ×2 IMPLANT
DECANTER SPIKE VIAL GLASS SM (MISCELLANEOUS) IMPLANT
DEVICE DUBIN W/COMP PLATE 8390 (MISCELLANEOUS) ×2 IMPLANT
DRAPE LAPAROSCOPIC ABDOMINAL (DRAPES) ×2 IMPLANT
DRSG TEGADERM 4X4.75 (GAUZE/BANDAGES/DRESSINGS) IMPLANT
ELECT COATED BLADE 2.86 ST (ELECTRODE) ×2 IMPLANT
ELECT REM PT RETURN 9FT ADLT (ELECTROSURGICAL) ×2
ELECTRODE REM PT RTRN 9FT ADLT (ELECTROSURGICAL) ×1 IMPLANT
GLOVE BIO SURGEON STRL SZ7 (GLOVE) ×4 IMPLANT
GLOVE BIOGEL M STRL SZ7.5 (GLOVE) ×1 IMPLANT
GLOVE BIOGEL PI IND STRL 7.5 (GLOVE) ×1 IMPLANT
GLOVE BIOGEL PI IND STRL 8 (GLOVE) IMPLANT
GLOVE BIOGEL PI INDICATOR 7.5 (GLOVE) ×1
GLOVE BIOGEL PI INDICATOR 8 (GLOVE) ×1
GLOVE EXAM NITRILE LRG STRL (GLOVE) ×1 IMPLANT
GOWN STRL REUS W/ TWL LRG LVL3 (GOWN DISPOSABLE) ×2 IMPLANT
GOWN STRL REUS W/ TWL XL LVL3 (GOWN DISPOSABLE) IMPLANT
GOWN STRL REUS W/TWL LRG LVL3 (GOWN DISPOSABLE) ×2
GOWN STRL REUS W/TWL XL LVL3 (GOWN DISPOSABLE) ×2
KIT MARKER MARGIN INK (KITS) ×2 IMPLANT
LIQUID BAND (GAUZE/BANDAGES/DRESSINGS) ×2 IMPLANT
MARKER SKIN DUAL TIP RULER LAB (MISCELLANEOUS) ×2 IMPLANT
NDL HYPO 25X1 1.5 SAFETY (NEEDLE) ×1 IMPLANT
NEEDLE HYPO 25X1 1.5 SAFETY (NEEDLE) ×2 IMPLANT
NS IRRIG 1000ML POUR BTL (IV SOLUTION) IMPLANT
PACK BASIN DAY SURGERY FS (CUSTOM PROCEDURE TRAY) ×2 IMPLANT
PENCIL BUTTON HOLSTER BLD 10FT (ELECTRODE) ×2 IMPLANT
SHEET MEDIUM DRAPE 40X70 STRL (DRAPES) IMPLANT
SLEEVE SCD COMPRESS KNEE MED (MISCELLANEOUS) ×2 IMPLANT
SPONGE GAUZE 4X4 12PLY STER LF (GAUZE/BANDAGES/DRESSINGS) IMPLANT
SPONGE LAP 4X18 X RAY DECT (DISPOSABLE) ×2 IMPLANT
STAPLER VISISTAT 35W (STAPLE) IMPLANT
STRIP CLOSURE SKIN 1/2X4 (GAUZE/BANDAGES/DRESSINGS) ×2 IMPLANT
SUT MNCRL AB 4-0 PS2 18 (SUTURE) ×1 IMPLANT
SUT MON AB 5-0 PS2 18 (SUTURE) IMPLANT
SUT SILK 2 0 SH (SUTURE) IMPLANT
SUT VIC AB 2-0 SH 27 (SUTURE) ×4
SUT VIC AB 2-0 SH 27XBRD (SUTURE) ×1 IMPLANT
SUT VIC AB 3-0 SH 27 (SUTURE) ×2
SUT VIC AB 3-0 SH 27X BRD (SUTURE) ×1 IMPLANT
SUT VIC AB 5-0 PS2 18 (SUTURE) IMPLANT
SYR CONTROL 10ML LL (SYRINGE) ×2 IMPLANT
TOWEL OR 17X24 6PK STRL BLUE (TOWEL DISPOSABLE) ×2 IMPLANT
TOWEL OR NON WOVEN STRL DISP B (DISPOSABLE) ×2 IMPLANT
TUBE CONNECTING 20X1/4 (TUBING) IMPLANT
YANKAUER SUCT BULB TIP NO VENT (SUCTIONS) IMPLANT

## 2014-12-21 NOTE — Anesthesia Postprocedure Evaluation (Signed)
Anesthesia Post Note  Patient: Cheryl CarnesShanon Navarro  Procedure(s) Performed: Procedure(s) (LRB): RADIOACTIVE SEED GUIDED EXCISIONAL RIGHT BREAST BIOPSY (Right)  Anesthesia type: General  Patient location: PACU  Post pain: Pain level controlled and Adequate analgesia  Post assessment: Post-op Vital signs reviewed, Patient's Cardiovascular Status Stable, Respiratory Function Stable, Patent Airway and Pain level controlled  Last Vitals:  Filed Vitals:   12/21/14 1400  BP: 119/71  Pulse: 64  Temp:   Resp: 13    Post vital signs: Reviewed and stable  Level of consciousness: awake, alert  and oriented  Complications: No apparent anesthesia complications

## 2014-12-21 NOTE — Discharge Instructions (Signed)
[facility] °Office Phone Number [phone] ° °POST OP INSTRUCTIONS ° °Always review your discharge instruction sheet given to you by the facility where your surgery was performed. ° °IF YOU HAVE DISABILITY OR FAMILY LEAVE FORMS, YOU MUST BRING THEM TO THE OFFICE FOR PROCESSING.  DO NOT GIVE THEM TO YOUR DOCTOR. ° °1. A prescription for pain medication may be given to you upon discharge.  Take your pain medication as prescribed, if needed.  If narcotic pain medicine is not needed, then you may take acetaminophen (Tylenol), naprosyn (Alleve) or ibuprofen (Advil) as needed. °2. Take your usually prescribed medications unless otherwise directed °3. If you need a refill on your pain medication, please contact your pharmacy.  They will contact our office to request authorization.  Prescriptions will not be filled after 5pm or on week-ends. °4. You should eat very light the first 24 hours after surgery, such as soup, crackers, pudding, etc.  Resume your normal diet the day after surgery. °5. Most patients will experience some swelling and bruising in the breast.  Ice packs and a good support bra will help.  Wear the breast binder provided or a sports bra for 72 hours day and night.  After that wear a sports bra during the day until you return to the office. Swelling and bruising can take several days to resolve.  °6. It is common to experience some constipation if taking pain medication after surgery.  Increasing fluid intake and taking a stool softener will usually help or prevent this problem from occurring.  A mild laxative (Milk of Magnesia or Miralax) should be taken according to package directions if there are no bowel movements after 48 hours. °7. Unless discharge instructions indicate otherwise, you may remove your bandages 48 hours after surgery and you may shower at that time.  You may have steri-strips (small skin tapes) in place directly over the incision.  These strips should be left on the  skin for 7-10 days and will come off on their own.  If your surgeon used skin glue on the incision, you may shower in 24 hours.  The glue will flake off over the next 2-3 weeks.  Any sutures or staples will be removed at the office during your follow-up visit. °8. ACTIVITIES:  You may resume regular daily activities (gradually increasing) beginning the next day.  Wearing a good support bra or sports bra minimizes pain and swelling.  You may have sexual intercourse when it is comfortable. °a. You may drive when you no longer are taking prescription pain medication, you can comfortably wear a seatbelt, and you can safely maneuver your car and apply brakes. °b. RETURN TO WORK:  ______________________________________________________________________________________ °9. You should see your doctor in the office for a follow-up appointment approximately two weeks after your surgery.  Your doctor’s nurse will typically make your follow-up appointment when she calls you with your pathology report.  Expect your pathology report 3-4 business days after your surgery.  You may call to check if you do not hear from us after three days. °10. OTHER INSTRUCTIONS: _______________________________________________________________________________________________ _____________________________________________________________________________________________________________________________________ °_____________________________________________________________________________________________________________________________________ °_____________________________________________________________________________________________________________________________________ ° °WHEN TO CALL DR WAKEFIELD: °1. Fever over 101.0 °2. Nausea and/or vomiting. °3. Extreme swelling or bruising. °4. Continued bleeding from incision. °5. Increased pain, redness, or drainage from the incision. ° °The clinic staff is available to answer your questions during regular  business hours.  Please don’t hesitate to call and ask to speak to one of the nurses for clinical concerns.  If   you have a medical emergency, go to the nearest emergency room or call 911.  A surgeon from [facility] is always on call at the hospital. ° °For further questions, please visit [email or web address] ° ° ° °Post Anesthesia Home Care Instructions ° °Activity: °Get plenty of rest for the remainder of the day. A responsible adult should stay with you for 24 hours following the procedure.  °For the next 24 hours, DO NOT: °-Drive a car °-Operate machinery °-Drink alcoholic beverages °-Take any medication unless instructed by your physician °-Make any legal decisions or sign important papers. ° °Meals: °Start with liquid foods such as gelatin or soup. Progress to regular foods as tolerated. Avoid greasy, spicy, heavy foods. If nausea and/or vomiting occur, drink only clear liquids until the nausea and/or vomiting subsides. Call your physician if vomiting continues. ° °Special Instructions/Symptoms: °Your throat may feel dry or sore from the anesthesia or the breathing tube placed in your throat during surgery. If this causes discomfort, gargle with warm salt water. The discomfort should disappear within 24 hours. ° °If you had a scopolamine patch placed behind your ear for the management of post- operative nausea and/or vomiting: ° °1. The medication in the patch is effective for 72 hours, after which it should be removed.  Wrap patch in a tissue and discard in the trash. Wash hands thoroughly with soap and water. °2. You may remove the patch earlier than 72 hours if you experience unpleasant side effects which may include dry mouth, dizziness or visual disturbances. °3. Avoid touching the patch. Wash your hands with soap and water after contact with the patch. °  ° °

## 2014-12-21 NOTE — Op Note (Signed)
Preoperative diagnosis: right breast radial scar Postoperative diagnosis: same as above Procedure: right breast seed guided excisional biopsy Surgeon: Dr Harden MoMatt Tyhir Schwan EBL: minimal Anes: general Complications none Drains none Specimens:  1. Right breast tissue marked with paint #2 additional right breast medial margin short superior, long lateral, double deep Sponge count was correct Disposition to recovery stable  Indications: This is a 44 yo female with an abnormal right breast mammogram. This underwent a core biopsy with finding of a radial scar. We discussed observation versus excision. We elected to do an excision of this area was seen guidance. She had a seed placed prior to beginning. I have these mammograms in the operating room.  Procedure: After informed consent was obtained the patient was taken to the operating room. She was given cefazolin. Sequential compression devices were on her legs. She was placed under general anesthesia without complication. Her breast was prepped and draped in the standard sterile surgical fashion. Surgical timeout was then performed.  I located the seed with the neoprobe. This was several centimeters away from both the nipple areolar complex in the axilla. I elected to make an axillary incision at the outer portion of the breast. I infiltrated Marcaine. I then used cautery and the neoprobe to excise the seed in the surrounding tissue. There was a fairly large hematoma that I entered during this as well. I ended up excising basically the entire hematoma cavity. Faxitron mammogram showed that my seed was in place. I confirmed this with the neoprobe. The clip was not present in this. I did excise an additional medial margin of what was a hematoma cavity. This clip was also not in this. I'm pretty sure that the clip has floated out of the hematoma I entered into the hematoma. There is no abnormal tissue present upon completion. I then obtained hemostasis. I  closed with 2-0 Vicryl. Then the skin was closed with 3-0 Vicryl and 4-0 Monocryl. Steri-Strips were placed. She tolerated this well was extubated and transferred to recovery stable.

## 2014-12-21 NOTE — Anesthesia Procedure Notes (Signed)
Procedure Name: LMA Insertion Date/Time: 12/21/2014 12:41 PM Performed by: Curly ShoresRAFT, Aireal Slater W Pre-anesthesia Checklist: Patient identified, Emergency Drugs available, Suction available and Patient being monitored Patient Re-evaluated:Patient Re-evaluated prior to inductionOxygen Delivery Method: Circle System Utilized Preoxygenation: Pre-oxygenation with 100% oxygen Intubation Type: IV induction Ventilation: Mask ventilation without difficulty LMA: LMA inserted LMA Size: 4.0 Number of attempts: 1 Airway Equipment and Method: Bite block Placement Confirmation: positive ETCO2 and breath sounds checked- equal and bilateral Tube secured with: Tape Dental Injury: Teeth and Oropharynx as per pre-operative assessment

## 2014-12-21 NOTE — Transfer of Care (Signed)
Immediate Anesthesia Transfer of Care Note  Patient: Cheryl Navarro  Procedure(s) Performed: Procedure(s): RADIOACTIVE SEED GUIDED EXCISIONAL RIGHT BREAST BIOPSY (Right)  Patient Location: PACU  Anesthesia Type:General  Level of Consciousness: awake, sedated and responds to stimulation  Airway & Oxygen Therapy: Patient Spontanous Breathing and Patient connected to face mask oxygen  Post-op Assessment: Report given to RN, Post -op Vital signs reviewed and stable and Patient moving all extremities  Post vital signs: Reviewed and stable  Last Vitals:  Filed Vitals:   12/21/14 1145  BP: 137/65  Pulse: 66  Temp: 37.6 C  Resp: 16    Complications: No apparent anesthesia complications

## 2014-12-21 NOTE — H&P (Signed)
[age] yof who has history of left breast FA removal as a teenager and family history in a paternal gm of breast cancer in her 5550s presents with abnormal right sided mm. after biopsy she noted right arm swelling/erythema for which she was treated with abx. this is resolving now. her mm showed B density breast tissue, right breast distortion. on us there is a 7 mm nodule at 9 oclock 2 cm from nac. she underwent biopsy with result being a csl.    Other Problems Gilmer Mor(Sonya Bynum, CMA; 12/18/2014 2:48 PM) Back Pain Chest pain Heart murmur Lump In Breast  Past Surgical History Gilmer Mor(Sonya Bynum, CMA; 12/18/2014 2:48 PM) Appendectomy Breast Biopsy Right. Cesarean Section - Multiple Foot Surgery Right. Oral Surgery  Diagnostic Studies History Gilmer Mor(Sonya Bynum, CMA; 12/18/2014 2:48 PM) Colonoscopy never Mammogram within last year Pap Smear 1-5 years ago  Allergies Gilmer Mor(Sonya Bynum, CMA; 12/18/2014 2:49 PM) No Known Drug Allergies05/24/2016  Medication History Lamar Laundry(Sonya Bynum, CMA; 12/18/2014 2:49 PM) Lovaza (1GM Capsule, Oral) Active. Medications Reconciled  Social History Gilmer Mor(Sonya Bynum, CMA; 12/18/2014 2:48 PM) Alcohol use Occasional alcohol use. Caffeine use Coffee, Tea. No drug use Tobacco use Never smoker.  Family History Gilmer Mor(Sonya Bynum, CMA; 12/18/2014 2:48 PM) Alcohol Abuse Daughter. Arthritis Mother. Breast Cancer Family Members In General. Colon Cancer Family Members In General. Depression Daughter, Mother. Heart Disease Family Members In General, Father. Heart disease in female family member before age 46 Hypertension Father, Mother, Sister.  Pregnancy / Birth History Gilmer Mor(Sonya Bynum, CMA; 12/18/2014 2:48 PM) Age at menarche 7 years. Contraceptive History Contraceptive implant, Oral contraceptives. Gravida 4 Maternal age 46-20 Para 4 Regular periods  Review of Systems Lamar Laundry(Sonya Bynum CMA; 12/18/2014 2:48 PM) General Present- Fatigue. Not Present- Appetite Loss,  Chills, Fever, Night Sweats, Weight Gain and Weight Loss. Skin Present- Dryness. Not Present- Change in Wart/Mole, Hives, Jaundice, New Lesions, Non-Healing Wounds, Rash and Ulcer. HEENT Present- Seasonal Allergies and Wears glasses/contact lenses. Not Present- Earache, Hearing Loss, Hoarseness, Nose Bleed, Oral Ulcers, Ringing in the Ears, Sinus Pain, Sore Throat, Visual Disturbances and Yellow Eyes. Respiratory Not Present- Bloody sputum, Chronic Cough, Difficulty Breathing, Snoring and Wheezing. Breast Present- Breast Mass. Not Present- Breast Pain, Nipple Discharge and Skin Changes. Cardiovascular Present- Palpitations. Not Present- Chest Pain, Difficulty Breathing Lying Down, Leg Cramps, Rapid Heart Rate, Shortness of Breath and Swelling of Extremities. Gastrointestinal Present- Abdominal Pain and Constipation. Not Present- Bloating, Bloody Stool, Change in Bowel Habits, Chronic diarrhea, Difficulty Swallowing, Excessive gas, Gets full quickly at meals, Hemorrhoids, Indigestion, Nausea, Rectal Pain and Vomiting. Female Genitourinary Not Present- Frequency, Nocturia, Painful Urination, Pelvic Pain and Urgency. Musculoskeletal Present- Back Pain and Joint Pain. Not Present- Joint Stiffness, Muscle Pain, Muscle Weakness and Swelling of Extremities. Neurological Not Present- Decreased Memory, Fainting, Headaches, Numbness, Seizures, Tingling, Tremor, Trouble walking and Weakness. Psychiatric Not Present- Anxiety, Bipolar, Change in Sleep Pattern, Depression, Fearful and Frequent crying. Endocrine Present- Hair Changes. Not Present- Cold Intolerance, Excessive Hunger, Heat Intolerance, Hot flashes and New Diabetes. Hematology Not Present- Easy Bruising, Excessive bleeding, Gland problems, HIV and Persistent Infections.   Vitals (Sonya Bynum CMA; 12/18/2014 2:49 PM) 12/18/2014 2:48 PM Weight: 144 lb Height: 63in Body Surface Area: 1.7 m Body Mass Index: 25.51 kg/m Temp.: 28F(Temporal)   Pulse: 88 (Regular)  BP: 124/80 (Sitting, Left Arm, Standard)    Physical Exam Emelia Loron(Jabril Pursell MD; 12/18/2014 3:29 PM) General Mental Status-Alert. Orientation-Oriented X3.  Chest and Lung Exam Chest and lung exam reveals -on auscultation, normal breath sounds, no adventitious  sounds and normal vocal resonance.  Breast Nipples-No Discharge. Breast Lump-No Palpable Breast Mass.  Cardiovascular Cardiovascular examination reveals -normal heart sounds, regular rate and rhythm with no murmurs.  Musculoskeletal Note: right ue with some mild erythema   Lymphatic Head & Neck  General Head & Neck Lymphatics: Bilateral - Description - Normal. Axillary  General Axillary Region: Bilateral - Description - Normal. Note: no [place] adenopathy     Assessment & Plan Emelia Loron MD; 12/18/2014 3:32 PM) RADIAL SCAR OF BREAST (611.89  N64.89) Story: discussed right breast radioactive seed guided excision we discussed indication being small chance (up to 15%) of finding other pathology including cancer although I think this is likely mostly removed at this point. we discussed observation with close interval follow up. we have decided to excise and will try to do this week. I did give her diflucan for a yeast infection which she has developed due to antibiotics for her arm.

## 2014-12-21 NOTE — Anesthesia Preprocedure Evaluation (Signed)
Anesthesia Evaluation  Patient identified by MRN, date of birth, ID band Patient awake    Reviewed: Allergy & Precautions, NPO status , Patient's Chart, lab work & pertinent test results  Airway Mallampati: II   Neck ROM: full    Dental   Pulmonary neg pulmonary ROS,  breath sounds clear to auscultation        Cardiovascular hypertension, Rhythm:regular Rate:Normal     Neuro/Psych    GI/Hepatic   Endo/Other    Renal/GU      Musculoskeletal   Abdominal   Peds  Hematology   Anesthesia Other Findings   Reproductive/Obstetrics                             Anesthesia Physical Anesthesia Plan  ASA: II  Anesthesia Plan: General   Post-op Pain Management:    Induction: Intravenous  Airway Management Planned: LMA  Additional Equipment:   Intra-op Plan:   Post-operative Plan:   Informed Consent: I have reviewed the patients History and Physical, chart, labs and discussed the procedure including the risks, benefits and alternatives for the proposed anesthesia with the patient or authorized representative who has indicated his/her understanding and acceptance.     Plan Discussed with: CRNA, Anesthesiologist and Surgeon  Anesthesia Plan Comments:         Anesthesia Quick Evaluation

## 2014-12-21 NOTE — Interval H&P Note (Signed)
History and Physical Interval Note:  12/21/2014 11:57 AM  Cheryl Navarro  has presented today for surgery, with the diagnosis of Right Breast radial scar  The various methods of treatment have been discussed with the patient and family. After consideration of risks, benefits and other options for treatment, the patient has consented to  Procedure(s): RADIOACTIVE SEED GUIDED EXCISIONAL RIGHT BREAST BIOPSY (Right) as a surgical intervention .  The patient's history has been reviewed, patient examined, no change in status, stable for surgery.  I have reviewed the patient's chart and labs.  Questions were answered to the patient's satisfaction.     [name]

## 2014-12-25 ENCOUNTER — Encounter (HOSPITAL_BASED_OUTPATIENT_CLINIC_OR_DEPARTMENT_OTHER): Payer: Self-pay | Admitting: General Surgery

## 2015-01-02 ENCOUNTER — Ambulatory Visit: Payer: BLUE CROSS/BLUE SHIELD | Attending: Specialist | Admitting: Physical Therapy

## 2015-01-02 DIAGNOSIS — R29898 Other symptoms and signs involving the musculoskeletal system: Secondary | ICD-10-CM | POA: Diagnosis not present

## 2015-01-02 DIAGNOSIS — M25511 Pain in right shoulder: Secondary | ICD-10-CM | POA: Diagnosis not present
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
## 2015-01-07 ENCOUNTER — Encounter: Payer: Self-pay | Admitting: Physical Therapy

## 2015-01-07 ENCOUNTER — Ambulatory Visit: Payer: BLUE CROSS/BLUE SHIELD | Admitting: Physical Therapy

## 2015-01-07 DIAGNOSIS — M25511 Pain in right shoulder: Secondary | ICD-10-CM | POA: Diagnosis not present

## 2015-01-07 DIAGNOSIS — R29898 Other symptoms and signs involving the musculoskeletal system: Secondary | ICD-10-CM

## 2015-01-07 NOTE — Patient Instructions (Signed)
Strengthening: Resisted Flexion   Hold tubing with left arm at side. Pull forward and up. Move shoulder through pain-free range of motion. Repeat _10___ times per set. Do _3___ sets per session. Do __2__ sessions per day.  http://orth.exer.us/824   Copyright  VHI. All rights reserved.  Strengthening: Resisted External Rotation   Hold tubing in right hand, elbow at side and forearm across body. Rotate forearm out. Repeat __10__ times per set. Do _3___ sets per session. Do __2__ sessions per day.  http://orth.exer.us/828   Copyright  VHI. All rights reserved.  Strengthening: Resisted Internal Rotation   Hold tubing in left hand, elbow at side and forearm out. Rotate forearm in across body. Repeat _10___ times per set. Do __3__ sets per session. Do __2__ sessions per day.  http://orth.exer.us/830   Copyright  VHI. All rights reserved.  Strengthening: Resisted Extension   Hold tubing in right hand, arm forward. Pull arm back, elbow straight. Repeat __10__ times per set. Do _3___ sets per session. Do _2___ sessions per day.  http://orth.exer.us/832   Copyright  VHI. All rights reserved.
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
## 2015-01-11 ENCOUNTER — Encounter: Payer: Self-pay | Admitting: Physical Therapy

## 2015-01-11 ENCOUNTER — Ambulatory Visit: Payer: BLUE CROSS/BLUE SHIELD | Admitting: Physical Therapy

## 2015-01-11 DIAGNOSIS — M25511 Pain in right shoulder: Secondary | ICD-10-CM

## 2015-01-11 DIAGNOSIS — R29898 Other symptoms and signs involving the musculoskeletal system: Secondary | ICD-10-CM
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
## 2015-01-14 ENCOUNTER — Ambulatory Visit: Payer: BLUE CROSS/BLUE SHIELD | Admitting: Physical Therapy

## 2015-01-14 ENCOUNTER — Encounter: Payer: Self-pay | Admitting: Physical Therapy

## 2015-01-14 DIAGNOSIS — M25511 Pain in right shoulder: Secondary | ICD-10-CM | POA: Diagnosis not present

## 2015-01-14 DIAGNOSIS — R29898 Other symptoms and signs involving the musculoskeletal system: Secondary | ICD-10-CM
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
## 2015-01-16 ENCOUNTER — Encounter: Payer: Self-pay | Admitting: Physical Therapy

## 2015-01-16 ENCOUNTER — Ambulatory Visit: Payer: BLUE CROSS/BLUE SHIELD | Admitting: Physical Therapy

## 2015-01-16 DIAGNOSIS — M25511 Pain in right shoulder: Secondary | ICD-10-CM

## 2015-01-16 DIAGNOSIS — R29898 Other symptoms and signs involving the musculoskeletal system: Secondary | ICD-10-CM

## 2015-01-16 NOTE — Patient Instructions (Signed)
Flexibility: Corner Stretch   Standing in corner with hands just above shoulder level and feet ____ inches from corner, lean forward until a comfortable stretch is felt across chest. Hold _30___ seconds. Repeat __3__ times per set. Do __1__ sets per session. Do _2-3___ sessions per day.  http://orth.exer.us/342   Copyright  VHI. All rights reserved.
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
## 2015-01-29 ENCOUNTER — Ambulatory Visit: Payer: BLUE CROSS/BLUE SHIELD | Attending: Specialist | Admitting: Physical Therapy

## 2015-01-29 ENCOUNTER — Encounter: Payer: Self-pay | Admitting: Physical Therapy

## 2015-01-29 DIAGNOSIS — M25511 Pain in right shoulder: Secondary | ICD-10-CM | POA: Diagnosis present

## 2015-01-29 DIAGNOSIS — R29898 Other symptoms and signs involving the musculoskeletal system: Secondary | ICD-10-CM | POA: Diagnosis present
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
## 2015-01-31 ENCOUNTER — Encounter: Payer: Self-pay | Admitting: Physical Therapy

## 2015-01-31 ENCOUNTER — Ambulatory Visit: Payer: BLUE CROSS/BLUE SHIELD | Admitting: Physical Therapy

## 2015-01-31 DIAGNOSIS — M25511 Pain in right shoulder: Secondary | ICD-10-CM

## 2015-01-31 DIAGNOSIS — R29898 Other symptoms and signs involving the musculoskeletal system: Secondary | ICD-10-CM

## 2015-01-31 NOTE — Patient Instructions (Signed)
Flexibility: Corner Stretch   Standing in corner with hands just above shoulder level and feet ____ inches from corner, lean forward until a comfortable stretch is felt across chest. Hold _30___ seconds. Repeat _3___ times per set. Do __1__ sets per session. Do _2-3___ sessions per day.  http://orth.exer.us/342   Copyright  VHI. All rights reserved.  Chest / Bicep Stretch   Lace fingers behind back and squeeze shoulder blades together. Slowly raise and straighten arms. Hold __30__ seconds. Repeat __3__ times per set. Do _1___ sets per session. Do __2-3__ sessions per day.  http://orth.exer.us/352   Copyright  VHI. All rights reserved.
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
## 2015-02-04 ENCOUNTER — Ambulatory Visit: Payer: BLUE CROSS/BLUE SHIELD | Admitting: Physical Therapy

## 2015-02-04 ENCOUNTER — Encounter: Payer: Self-pay | Admitting: Physical Therapy

## 2015-02-04 DIAGNOSIS — M25511 Pain in right shoulder: Secondary | ICD-10-CM | POA: Diagnosis not present

## 2015-02-04 DIAGNOSIS — R29898 Other symptoms and signs involving the musculoskeletal system: Secondary | ICD-10-CM
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
## 2015-02-06 ENCOUNTER — Encounter: Payer: Self-pay | Admitting: Physical Therapy

## 2015-02-06 ENCOUNTER — Ambulatory Visit: Payer: BLUE CROSS/BLUE SHIELD | Admitting: Physical Therapy

## 2015-02-06 DIAGNOSIS — M25511 Pain in right shoulder: Secondary | ICD-10-CM | POA: Diagnosis not present

## 2015-02-06 DIAGNOSIS — R29898 Other symptoms and signs involving the musculoskeletal system: Secondary | ICD-10-CM
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
## 2015-04-02 ENCOUNTER — Ambulatory Visit (INDEPENDENT_AMBULATORY_CARE_PROVIDER_SITE_OTHER): Payer: BLUE CROSS/BLUE SHIELD | Admitting: Family Medicine

## 2015-04-02 ENCOUNTER — Encounter: Payer: Self-pay | Admitting: Family Medicine

## 2015-04-02 VITALS — BP 123/78 | HR 68 | Temp 98.1°F | Ht 63.0 in | Wt 136.6 lb

## 2015-04-02 DIAGNOSIS — N390 Urinary tract infection, site not specified: Secondary | ICD-10-CM | POA: Insufficient documentation

## 2015-04-02 DIAGNOSIS — N3001 Acute cystitis with hematuria: Secondary | ICD-10-CM | POA: Diagnosis not present

## 2015-04-02 DIAGNOSIS — R109 Unspecified abdominal pain: Secondary | ICD-10-CM

## 2015-04-02 LAB — POCT URINALYSIS DIPSTICK
BILIRUBIN UA: NEGATIVE
GLUCOSE UA: NEGATIVE
Ketones, UA: NEGATIVE
NITRITE UA: NEGATIVE
Protein, UA: NEGATIVE
Spec Grav, UA: 1.02
Urobilinogen, UA: NEGATIVE
pH, UA: 6

## 2015-04-02 LAB — POCT UA - MICROSCOPIC ONLY
CRYSTALS, UR, HPF, POC: NEGATIVE
Casts, Ur, LPF, POC: NEGATIVE
Yeast, UA: NEGATIVE

## 2015-04-02 MED ORDER — FLUCONAZOLE 150 MG PO TABS: 150.0000 mg | ORAL_TABLET | Freq: Once | ORAL | Status: DC

## 2015-04-02 MED ORDER — CIPROFLOXACIN HCL 500 MG PO TABS: 500.0000 mg | ORAL_TABLET | Freq: Two times a day (BID) | ORAL | Status: DC

## 2015-04-02 NOTE — Progress Notes (Signed)
   HPI  Patient presents today with concern for UTI  Patient's when she's had suprapubic tenderness for a week or 2 now. For the last 2 days she's had dysuria, urinary frequency, and urinary urgency.  She denies fevers, chills, loss of appetite, shortness of breath, or difficulty tolerating food or fluid by mouth.  She has not had frequent UTIs.  She does have issues with yeast infections with antibiotics  PMH: Smoking status noted ROS: Per HPI  Objective: BP 123/78 mmHg  Pulse 68  Temp(Src) 98.1 F (36.7 C) (Oral)  Ht  (1.6 m)  Wt 136 lb 9.6 oz (61.961 kg)  BMI 24.20 kg/m2 Gen: NAD, alert, cooperative with exam HEENT: NCAT CV: RRR, good S1/S2, no murmur Resp: CTABL, no wheezes, non-labored Abd: Off, mild suprapubic tenderness, no CVA tenderness Ext: No edema, warm Neuro: Alert and oriented, No gross deficits  Assessment and plan:  # UTI Urinalysis and symptoms consistent with UTI Treat with Cipro 3 days, given Diflucan to use  if yeast infection develops   Orders Placed This Encounter  Procedures  . Urine culture  . POCT urinalysis dipstick  . POCT UA - Microscopic Only    Meds ordered this encounter  Medications  . ciprofloxacin (CIPRO) 500 MG tablet    Sig: Take 1 tablet (500 mg total) by mouth 2 (two) times daily.    Dispense:  6 tablet    Refill:  0  . fluconazole (DIFLUCAN) 150 MG tablet    Sig: Take 1 tablet (150 mg total) by mouth once. And repeat in 3 days    Dispense:  2 tablet    Refill:  0    Murtis Sink, MD [facility] 04/02/2015, 12:08 PM

## 2015-04-02 NOTE — Patient Instructions (Signed)
Great to meet you!  We will let you know if the urine culture shows an unexpected result  Come back if you have any concerns.   Urinary Tract Infection Urinary tract infections (UTIs) can develop anywhere along your urinary tract. Your urinary tract is your body's drainage system for removing wastes and extra water. Your urinary tract includes two kidneys, two ureters, a bladder, and a urethra. Your kidneys are a pair of bean-shaped organs. Each kidney is about the size of your fist. They are located below your ribs, one on each side of your spine. CAUSES Infections are caused by microbes, which are microscopic organisms, including fungi, viruses, and bacteria. These organisms are so small that they can only be seen through a microscope. Bacteria are the microbes that most commonly cause UTIs. SYMPTOMS  Symptoms of UTIs may vary by age and gender of the patient and by the location of the infection. Symptoms in young women typically include a frequent and intense urge to urinate and a painful, burning feeling in the bladder or urethra during urination. Older women and men are more likely to be tired, shaky, and weak and have muscle aches and abdominal pain. A fever may mean the infection is in your kidneys. Other symptoms of a kidney infection include pain in your back or sides below the ribs, nausea, and vomiting. DIAGNOSIS To diagnose a UTI, your caregiver will ask you about your symptoms. Your caregiver also will ask to provide a urine sample. The urine sample will be tested for bacteria and white blood cells. White blood cells are made by your body to help fight infection. TREATMENT  Typically, UTIs can be treated with medication. Because most UTIs are caused by a bacterial infection, they usually can be treated with the use of antibiotics. The choice of antibiotic and length of treatment depend on your symptoms and the type of bacteria causing your infection. HOME CARE INSTRUCTIONS  If you were  prescribed antibiotics, take them exactly as your caregiver instructs you. Finish the medication even if you feel better after you have only taken some of the medication.  Drink enough water and fluids to keep your urine clear or pale yellow.  Avoid caffeine, tea, and carbonated beverages. They tend to irritate your bladder.  Empty your bladder often. Avoid holding urine for long periods of time.  Empty your bladder before and after sexual intercourse.  After a bowel movement, women should cleanse from front to back. Use each tissue only once. SEEK MEDICAL CARE IF:   You have back pain.  You develop a fever.  Your symptoms do not begin to resolve within 3 days. SEEK IMMEDIATE MEDICAL CARE IF:   You have severe back pain or lower abdominal pain.  You develop chills.  You have nausea or vomiting.  You have continued burning or discomfort with urination. MAKE SURE YOU:   Understand these instructions.  Will watch your condition.  Will get help right away if you are not doing well or get worse. Document Released: 04/22/2005 Document Revised: 01/12/2012 Document Reviewed: 08/21/2011 [facility] Patient Information 2015 [place], [place]. This information is not intended to replace advice given to you by your health care provider. Make sure you discuss any questions you have with your health care provider.

## 2015-04-04 LAB — URINE CULTURE

## 2015-04-18 ENCOUNTER — Ambulatory Visit (INDEPENDENT_AMBULATORY_CARE_PROVIDER_SITE_OTHER): Payer: BLUE CROSS/BLUE SHIELD | Admitting: Nurse Practitioner

## 2015-04-18 ENCOUNTER — Encounter: Payer: Self-pay | Admitting: Nurse Practitioner

## 2015-04-18 ENCOUNTER — Telehealth: Payer: Self-pay | Admitting: Nurse Practitioner

## 2015-04-18 DIAGNOSIS — J209 Acute bronchitis, unspecified: Secondary | ICD-10-CM

## 2015-04-18 DIAGNOSIS — J0100 Acute maxillary sinusitis, unspecified: Secondary | ICD-10-CM

## 2015-04-18 MED ORDER — AZITHROMYCIN 250 MG PO TABS: ORAL_TABLET | ORAL | Status: DC

## 2015-04-18 NOTE — Progress Notes (Signed)
  Subjective:     Cheryl Navarro is a 44 y.o. female who presents for evaluation of sinus pain. Symptoms include: congestion, cough, fevers, headaches, nasal congestion, post nasal drip, sinus pressure and sore throat. Onset of symptoms was 7 days ago. Symptoms have been gradually worsening since that time. Past history is significant for no history of pneumonia or bronchitis. Patient is a non-smoker.  The following portions of the patient's history were reviewed and updated as appropriate: allergies, current medications, past family history, past medical history, past social history, past surgical history and problem list.  Review of Systems Pertinent items are noted in HPI.   Objective:    There were no vitals taken for this visit. General appearance: alert and cooperative Eyes: conjunctivae/corneas clear. PERRL, EOM's intact. Fundi benign. Ears: normal TM's and external ear canals both ears Nose: no discharge, clear discharge, moderate congestion, turbinates pale, sinus tenderness bilateral Throat: lips, mucosa, and tongue normal; teeth and gums normal Neck: no adenopathy, no carotid bruit, no JVD, supple, symmetrical, trachea midline and thyroid not enlarged, symmetric, no tenderness/mass/nodules Lungs: clear to auscultation bilaterally Heart: regular rate and rhythm, S1, S2 normal, no murmur, click, rub or gallop    Assessment:    Acute bacterial sinusitis and bronchitis.    Plan:  1. Take meds as prescribed 2. Use a cool mist humidifier especially during the winter months and when heat has been humid. 3. Use saline nose sprays frequently 4. Saline irrigations of the nose can be very helpful if done frequently.  * 4X daily for 1 week*  * Use of a nettie pot can be helpful with this. Follow directions with this* 5. Drink plenty of fluids 6. Keep thermostat turn down low 7.For any cough or congestion  Use plain Mucinex- regular strength or max strength is fine   * Children-  consult with Pharmacist for dosing 8. For fever or aces or pains- take tylenol or ibuprofen appropriate for age and weight.  * for fevers greater than 101 orally you may alternate ibuprofen and tylenol every  3 hours.    Meds ordered this encounter  Medications  . azithromycin (ZITHROMAX Z-PAK) 250 MG tablet    Sig: As directed    Dispense:  1 each    Refill:  0    Order Specific Question:  Supervising Provider    Answer:  Ernestina Penna [[id number]]   Mary-Margaret Daphine Deutscher, FNP

## 2015-04-18 NOTE — Patient Instructions (Signed)

## 2015-08-30 ENCOUNTER — Ambulatory Visit (INDEPENDENT_AMBULATORY_CARE_PROVIDER_SITE_OTHER): Payer: BLUE CROSS/BLUE SHIELD | Admitting: Family Medicine

## 2015-08-30 VITALS — BP 121/81 | HR 68 | Temp 98.6°F | Ht 63.0 in | Wt 142.0 lb

## 2015-08-30 DIAGNOSIS — B9689 Other specified bacterial agents as the cause of diseases classified elsewhere: Secondary | ICD-10-CM

## 2015-08-30 DIAGNOSIS — N9489 Other specified conditions associated with female genital organs and menstrual cycle: Secondary | ICD-10-CM

## 2015-08-30 DIAGNOSIS — N760 Acute vaginitis: Secondary | ICD-10-CM

## 2015-08-30 DIAGNOSIS — N898 Other specified noninflammatory disorders of vagina: Secondary | ICD-10-CM

## 2015-08-30 DIAGNOSIS — A499 Bacterial infection, unspecified: Secondary | ICD-10-CM | POA: Diagnosis not present

## 2015-08-30 LAB — POCT URINALYSIS DIPSTICK
BILIRUBIN UA: NEGATIVE
Blood, UA: NEGATIVE
Glucose, UA: NEGATIVE
KETONES UA: NEGATIVE
Leukocytes, UA: NEGATIVE
Nitrite, UA: NEGATIVE
PH UA: 7.5
Protein, UA: NEGATIVE
Urobilinogen, UA: NEGATIVE

## 2015-08-30 LAB — POCT WET PREP (WET MOUNT)
KOH WET PREP POC: POSITIVE
TRICHOMONAS WET PREP HPF POC: NEGATIVE

## 2015-08-30 LAB — POCT URINE PREGNANCY: Preg Test, Ur: NEGATIVE

## 2015-08-30 MED ORDER — METRONIDAZOLE 500 MG PO TABS: 500.0000 mg | ORAL_TABLET | Freq: Two times a day (BID) | ORAL | Status: DC
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
## 2015-09-06 ENCOUNTER — Ambulatory Visit (INDEPENDENT_AMBULATORY_CARE_PROVIDER_SITE_OTHER): Payer: BLUE CROSS/BLUE SHIELD | Admitting: Family

## 2015-09-06 ENCOUNTER — Telehealth: Payer: Self-pay | Admitting: Family Medicine

## 2015-09-06 ENCOUNTER — Encounter: Payer: Self-pay | Admitting: Family

## 2015-09-06 VITALS — BP 123/76 | HR 70 | Temp 97.7°F | Ht 63.0 in | Wt 144.4 lb

## 2015-09-06 DIAGNOSIS — N898 Other specified noninflammatory disorders of vagina: Secondary | ICD-10-CM

## 2015-09-06 DIAGNOSIS — A499 Bacterial infection, unspecified: Secondary | ICD-10-CM | POA: Diagnosis not present

## 2015-09-06 DIAGNOSIS — N760 Acute vaginitis: Secondary | ICD-10-CM | POA: Diagnosis not present

## 2015-09-06 DIAGNOSIS — B9689 Other specified bacterial agents as the cause of diseases classified elsewhere: Secondary | ICD-10-CM

## 2015-09-06 LAB — POCT WET PREP (WET MOUNT): TRICHOMONAS WET PREP HPF POC: NEGATIVE

## 2015-09-06 MED ORDER — METRONIDAZOLE 0.75 % EX GEL: 1.0000 "application " | Freq: Every day | CUTANEOUS | Status: DC

## 2015-09-06 NOTE — Progress Notes (Addendum)
   Subjective:    Patient ID: Cheryl Navarro, female    DOB: 1970-12-26, 45 y.o.   MRN: [id number]  PT presents to the office for recurrent BV symptoms. Pt was seen in the office 08/30/15 and was given for rx of flagyl 500 mg BID for 7 days. PT states the discharge improved, but continues. Pt states the odor has improved.  Vaginal Discharge The patient's primary symptoms include a genital odor and vaginal discharge. The patient's pertinent negatives include no genital itching. This is a recurrent problem. The current episode started in the past 7 days. The problem occurs intermittently. The problem has been waxing and waning. The pain is mild. The problem affects both sides. Associated symptoms include nausea. Pertinent negatives include no back pain, chills, constipation, diarrhea, discolored urine, dysuria, fever, flank pain, frequency, headaches or hematuria. The vaginal discharge was yellow. She has not been passing clots. She has tried antibiotics for the symptoms. The treatment provided moderate relief.      Review of Systems  Constitutional: Negative.  Negative for fever and chills.  HENT: Negative.   Eyes: Negative.   Respiratory: Negative.  Negative for shortness of breath.   Cardiovascular: Negative.  Negative for palpitations.  Gastrointestinal: Positive for nausea. Negative for diarrhea and constipation.  Endocrine: Negative.   Genitourinary: Positive for vaginal discharge. Negative for dysuria, frequency, hematuria and flank pain.  Musculoskeletal: Negative.  Negative for back pain.  Neurological: Negative.  Negative for headaches.  Hematological: Negative.   Psychiatric/Behavioral: Negative.   All other systems reviewed and are negative.      Objective:   Physical Exam  Constitutional: She is oriented to person, place, and time. She appears well-developed and well-nourished. No distress.  HENT:  Head: Normocephalic and atraumatic.  Eyes: Pupils are equal, round, and  reactive to light.  Neck: Normal range of motion. Neck supple. No thyromegaly present.  Cardiovascular: Normal rate, regular rhythm, normal heart sounds and intact distal pulses.   No murmur heard. Pulmonary/Chest: Effort normal and breath sounds normal. No respiratory distress. She has no wheezes.  Abdominal: Soft. Bowel sounds are normal. She exhibits no distension. There is no tenderness.  Musculoskeletal: Normal range of motion. She exhibits no edema or tenderness.  Neurological: She is alert and oriented to person, place, and time. She has normal reflexes. No cranial nerve deficit.  Skin: Skin is warm and dry.  Psychiatric: She has a normal mood and affect. Her behavior is normal. Judgment and thought content normal.  Vitals reviewed.   BP 123/76 mmHg  Pulse 70  Temp(Src) 97.7 F (36.5 C) (Oral)  Ht  (1.6 m)  Wt 144 lb 6.4 oz (65.499 kg)  BMI 25.59 kg/m2       Assessment & Plan:  1. Vaginal discharge - POCT Wet Prep [facility])  2. BV (bacterial vaginosis) -Cotton underwear -Keep clean and dry -Avoid douche -RTO prn - metroNIDAZOLE (METROGEL) 0.75 % gel; Apply 1 application topically at bedtime. For 5 days  Dispense: 45 g; Refill: 0  Jannifer Rodney, FNP

## 2015-09-06 NOTE — Patient Instructions (Signed)

## 2015-09-06 NOTE — Telephone Encounter (Signed)
Patient has one antibiotic pill left, still having some vaginal irritation and thinks infection may not be completely cleared up.  Per patient request, appointment made at 5:30 pm today.

## 2015-09-18 ENCOUNTER — Other Ambulatory Visit: Payer: Self-pay | Admitting: Obstetrics & Gynecology

## 2015-10-02 ENCOUNTER — Encounter: Payer: Self-pay | Admitting: Family Medicine

## 2015-10-02 ENCOUNTER — Ambulatory Visit (INDEPENDENT_AMBULATORY_CARE_PROVIDER_SITE_OTHER): Payer: BLUE CROSS/BLUE SHIELD | Admitting: Family Medicine

## 2015-10-02 VITALS — BP 132/83 | HR 95 | Temp 98.4°F | Ht 63.0 in | Wt 145.4 lb

## 2015-10-02 DIAGNOSIS — J0100 Acute maxillary sinusitis, unspecified: Secondary | ICD-10-CM | POA: Diagnosis not present

## 2015-10-02 MED ORDER — AMOXICILLIN 875 MG PO TABS: 875.0000 mg | ORAL_TABLET | Freq: Two times a day (BID) | ORAL | Status: DC

## 2015-10-02 NOTE — Patient Instructions (Signed)
Thank you for allowing us to care for you today. We strive to provide exceptional quality and compassionate care. Please let us know how we are doing and how we can help serve you better by filling out the survey that you receive from [employer].     

## 2015-10-02 NOTE — Progress Notes (Signed)
Patient ID: Cheryl CarnesShanon Vernon, female   DOB: 12/11/1970, 45 y.o.   MRN: [id number]  Primary Physician: Bennie PieriniMARTIN,MARY MARGARET, FNP  Chief Complaint: 45 year old female with headache and sinus drainage cough productive of progressively darker sputum. She has been using over-the-counter Mucinex and Flonase with some relief. She also has taken Zicam for what she thought was initially a upper respiratory infection     Past Medical History  Diagnosis Date  . Hyperlipidemia     no longer on meds after diet changes  . Family history of heart disease   . Chest pain   . HTN (hypertension)     no meds, denies HTN now  . Skin infection 11/2014    arm swollen and infected following breast Biopsy per pt.     Home Meds: Prior to Admission medications   Medication Sig Start Date End Date Taking? Authorizing Provider  omega-3 acid ethyl esters (LOVAZA) 1 G capsule Take 1 g by mouth 2 (two) times daily. Reported on 09/06/2015    Historical Provider, MD    Allergies: No Known Allergies  Social History   Social History  . Marital Status: Married    Spouse Name: N/A  . Number of Children: N/A  . Years of Education: N/A   Occupational History  . Not on file.   Social History Main Topics  . Smoking status: Never Smoker   . Smokeless tobacco: Never Used  . Alcohol Use: No  . Drug Use: No  . Sexual Activity: Not on file   Other Topics Concern  . Not on file   Social History Narrative     Review of Systems: Constitutional: negative for chills, fever, night sweats, weight changes, or fatigue  HEENT: negative for vision changes, hearing loss; has congestion, rhinorrhea, ST,  and sinus pressure Cardiovascular: negative for chest pain or palpitations Respiratory: negative for hemoptysis, wheezing, shortness of breath, or cough Abdominal: negative for abdominal pain, nausea, vomiting, diarrhea, or constipation Dermatological: negative for rash Neurologic: negative for headache, dizziness, or  syncope All other systems reviewed and are otherwise negative with the exception to those above and in the HPI.   Physical Exam: Blood pressure 132/83, pulse 95, temperature 98.4 F (36.9 C), temperature source Oral, height 5\' 3"  (1.6 m), weight 145 lb 6.4 oz (65.953 kg)., Body mass index is 25.76 kg/(m^2). General: Well developed, well nourished, in no acute distress. Head: Normocephalic, atraumatic, eyes without discharge, sclera non-icteric, nares are without discharge. Bilateral auditory canals clear, TM's are without perforation, pearly grey and translucent with reflective cone of light bilaterally. Oral cavity moist, posterior pharynx without exudate, erythema, peritonsillar abscess, or post nasal drip. Sinuses are tender to percussion in the maxillary and frontal areas  Neck: Supple. No thyromegaly. Full ROM. No lymphadenopathy. Lungs: Clear bilaterally to auscultation without wheezes, rales, or rhonchi. Breathing is unlabored. Heart: RRR with S1 S2. No murmurs, rubs, or gallops appreciated.    Labs:none   ASSESSMENT AND PLAN:   1. Acute maxillary sinusitis, recurrence not specified Continue Flonase and Mucinex. Will add amoxicillin 875 twice a day for 10 days. Also hot showers warm compresses.  Frederica KusterStephen M Theophile Harvie MD   10/02/2015 10:50 AM

## 2016-03-25 ENCOUNTER — Other Ambulatory Visit: Payer: Self-pay | Admitting: Obstetrics & Gynecology

## 2016-04-30 DIAGNOSIS — J00 Acute nasopharyngitis [common cold]: Secondary | ICD-10-CM | POA: Diagnosis not present

## 2016-04-30 DIAGNOSIS — J018 Other acute sinusitis: Secondary | ICD-10-CM | POA: Diagnosis not present

## 2016-06-02 DIAGNOSIS — L259 Unspecified contact dermatitis, unspecified cause: Secondary | ICD-10-CM | POA: Diagnosis not present

## 2016-06-08 DIAGNOSIS — L259 Unspecified contact dermatitis, unspecified cause: Secondary | ICD-10-CM | POA: Diagnosis not present

## 2016-09-01 ENCOUNTER — Ambulatory Visit: Payer: BLUE CROSS/BLUE SHIELD | Admitting: Physician Assistant

## 2016-09-01 ENCOUNTER — Telehealth: Payer: Self-pay | Admitting: Nurse Practitioner

## 2016-09-02 ENCOUNTER — Encounter: Payer: Self-pay | Admitting: Nurse Practitioner

## 2016-09-02 NOTE — Telephone Encounter (Signed)
In the past we would patch the eye and use antibiotic ointment but the tendency lately has been to let nature heal without any medication. Given this information if she still desires antibiotic drops and she is not allergic to anything we did use sodium still a med monitoring drops 4 times a day

## 2016-09-02 NOTE — Telephone Encounter (Signed)
lmtcb

## 2016-09-02 NOTE — Telephone Encounter (Signed)
Will need to be seen

## 2016-09-02 NOTE — Telephone Encounter (Signed)
Pt stated she used saline and it got better

## 2016-09-07 DIAGNOSIS — S0501XA Injury of conjunctiva and corneal abrasion without foreign body, right eye, initial encounter: Secondary | ICD-10-CM | POA: Diagnosis not present

## 2016-09-10 NOTE — Telephone Encounter (Signed)
Pt no showed for the appointment.

## 2016-10-06 DIAGNOSIS — N390 Urinary tract infection, site not specified: Secondary | ICD-10-CM | POA: Diagnosis not present

## 2016-10-13 DIAGNOSIS — B373 Candidiasis of vulva and vagina: Secondary | ICD-10-CM | POA: Diagnosis not present

## 2016-10-13 DIAGNOSIS — N3001 Acute cystitis with hematuria: Secondary | ICD-10-CM | POA: Diagnosis not present

## 2016-11-03 DIAGNOSIS — L570 Actinic keratosis: Secondary | ICD-10-CM | POA: Diagnosis not present

## 2016-11-03 DIAGNOSIS — L853 Xerosis cutis: Secondary | ICD-10-CM | POA: Diagnosis not present

## 2016-11-03 DIAGNOSIS — D485 Neoplasm of uncertain behavior of skin: Secondary | ICD-10-CM | POA: Diagnosis not present

## 2016-11-03 DIAGNOSIS — D1800 Hemangioma unspecified site: Secondary | ICD-10-CM | POA: Diagnosis not present

## 2016-11-03 DIAGNOSIS — L820 Inflamed seborrheic keratosis: Secondary | ICD-10-CM | POA: Diagnosis not present

## 2017-02-08 DIAGNOSIS — Z1231 Encounter for screening mammogram for malignant neoplasm of breast: Secondary | ICD-10-CM | POA: Diagnosis not present

## 2017-02-08 DIAGNOSIS — Z6824 Body mass index (BMI) 24.0-24.9, adult: Secondary | ICD-10-CM | POA: Diagnosis not present

## 2017-03-26 DIAGNOSIS — R350 Frequency of micturition: Secondary | ICD-10-CM | POA: Diagnosis not present

## 2017-03-26 DIAGNOSIS — Z113 Encounter for screening for infections with a predominantly sexual mode of transmission: Secondary | ICD-10-CM | POA: Diagnosis not present

## 2017-03-26 DIAGNOSIS — Z6824 Body mass index (BMI) 24.0-24.9, adult: Secondary | ICD-10-CM | POA: Diagnosis not present

## 2017-03-26 DIAGNOSIS — N760 Acute vaginitis: Secondary | ICD-10-CM | POA: Diagnosis not present

## 2017-11-03 DIAGNOSIS — Z124 Encounter for screening for malignant neoplasm of cervix: Secondary | ICD-10-CM | POA: Diagnosis not present

## 2017-11-03 DIAGNOSIS — Z6824 Body mass index (BMI) 24.0-24.9, adult: Secondary | ICD-10-CM | POA: Diagnosis not present

## 2017-11-03 DIAGNOSIS — Z01419 Encounter for gynecological examination (general) (routine) without abnormal findings: Secondary | ICD-10-CM | POA: Diagnosis not present

## 2017-11-08 DIAGNOSIS — Z6824 Body mass index (BMI) 24.0-24.9, adult: Secondary | ICD-10-CM | POA: Diagnosis not present

## 2017-11-08 DIAGNOSIS — A5403 Gonococcal cervicitis, unspecified: Secondary | ICD-10-CM | POA: Diagnosis not present

## 2018-02-24 DIAGNOSIS — Z1231 Encounter for screening mammogram for malignant neoplasm of breast: Secondary | ICD-10-CM | POA: Diagnosis not present

## 2018-02-24 DIAGNOSIS — Z113 Encounter for screening for infections with a predominantly sexual mode of transmission: Secondary | ICD-10-CM | POA: Diagnosis not present

## 2018-02-24 DIAGNOSIS — Z6824 Body mass index (BMI) 24.0-24.9, adult: Secondary | ICD-10-CM | POA: Diagnosis not present

## 2018-03-02 DIAGNOSIS — H6122 Impacted cerumen, left ear: Secondary | ICD-10-CM | POA: Diagnosis not present

## 2018-03-02 DIAGNOSIS — H9192 Unspecified hearing loss, left ear: Secondary | ICD-10-CM | POA: Diagnosis not present

## 2018-03-02 DIAGNOSIS — H9202 Otalgia, left ear: Secondary | ICD-10-CM | POA: Diagnosis not present

## 2018-05-20 DIAGNOSIS — D1801 Hemangioma of skin and subcutaneous tissue: Secondary | ICD-10-CM | POA: Diagnosis not present

## 2018-05-20 DIAGNOSIS — L814 Other melanin hyperpigmentation: Secondary | ICD-10-CM | POA: Diagnosis not present

## 2018-05-20 DIAGNOSIS — D225 Melanocytic nevi of trunk: Secondary | ICD-10-CM | POA: Diagnosis not present

## 2018-05-20 DIAGNOSIS — L821 Other seborrheic keratosis: Secondary | ICD-10-CM | POA: Diagnosis not present

## 2019-04-13 DIAGNOSIS — Z1231 Encounter for screening mammogram for malignant neoplasm of breast: Secondary | ICD-10-CM | POA: Diagnosis not present

## 2019-06-27 DIAGNOSIS — M50122 Cervical disc disorder at C5-C6 level with radiculopathy: Secondary | ICD-10-CM | POA: Diagnosis not present

## 2019-06-27 DIAGNOSIS — M9901 Segmental and somatic dysfunction of cervical region: Secondary | ICD-10-CM | POA: Diagnosis not present

## 2019-06-27 DIAGNOSIS — M9902 Segmental and somatic dysfunction of thoracic region: Secondary | ICD-10-CM | POA: Diagnosis not present

## 2019-07-13 DIAGNOSIS — J039 Acute tonsillitis, unspecified: Secondary | ICD-10-CM | POA: Diagnosis not present

## 2019-07-28 LAB — HM MAMMOGRAPHY: HM Mammogram: NORMAL (ref 0–4)

## 2019-07-28 LAB — COLOGUARD: Cologuard: NEGATIVE

## 2019-09-01 DIAGNOSIS — L659 Nonscarring hair loss, unspecified: Secondary | ICD-10-CM | POA: Diagnosis not present

## 2019-09-01 DIAGNOSIS — Z780 Asymptomatic menopausal state: Secondary | ICD-10-CM | POA: Diagnosis not present

## 2019-09-01 DIAGNOSIS — R635 Abnormal weight gain: Secondary | ICD-10-CM | POA: Diagnosis not present

## 2019-09-01 DIAGNOSIS — R5382 Chronic fatigue, unspecified: Secondary | ICD-10-CM | POA: Diagnosis not present

## 2019-10-09 DIAGNOSIS — H04123 Dry eye syndrome of bilateral lacrimal glands: Secondary | ICD-10-CM | POA: Diagnosis not present

## 2019-11-07 DIAGNOSIS — Z01419 Encounter for gynecological examination (general) (routine) without abnormal findings: Secondary | ICD-10-CM | POA: Diagnosis not present

## 2019-11-07 DIAGNOSIS — Z6825 Body mass index (BMI) 25.0-25.9, adult: Secondary | ICD-10-CM | POA: Diagnosis not present

## 2019-11-07 DIAGNOSIS — Z124 Encounter for screening for malignant neoplasm of cervix: Secondary | ICD-10-CM | POA: Diagnosis not present

## 2019-11-09 LAB — HM PAP SMEAR: HM Pap smear: NEGATIVE

## 2020-04-09 DIAGNOSIS — R05 Cough: Secondary | ICD-10-CM | POA: Diagnosis not present

## 2020-04-09 DIAGNOSIS — Z1159 Encounter for screening for other viral diseases: Secondary | ICD-10-CM | POA: Diagnosis not present

## 2020-04-12 DIAGNOSIS — J019 Acute sinusitis, unspecified: Secondary | ICD-10-CM | POA: Diagnosis not present

## 2020-04-17 DIAGNOSIS — Z1231 Encounter for screening mammogram for malignant neoplasm of breast: Secondary | ICD-10-CM | POA: Diagnosis not present

## 2020-05-20 DIAGNOSIS — N3000 Acute cystitis without hematuria: Secondary | ICD-10-CM | POA: Diagnosis not present

## 2020-05-20 DIAGNOSIS — R350 Frequency of micturition: Secondary | ICD-10-CM | POA: Diagnosis not present

## 2020-05-20 DIAGNOSIS — R300 Dysuria: Secondary | ICD-10-CM | POA: Diagnosis not present

## 2020-05-20 DIAGNOSIS — B373 Candidiasis of vulva and vagina: Secondary | ICD-10-CM | POA: Diagnosis not present

## 2020-05-27 DIAGNOSIS — M545 Low back pain, unspecified: Secondary | ICD-10-CM | POA: Diagnosis not present

## 2020-08-05 DIAGNOSIS — L718 Other rosacea: Secondary | ICD-10-CM | POA: Diagnosis not present

## 2020-08-05 DIAGNOSIS — D225 Melanocytic nevi of trunk: Secondary | ICD-10-CM | POA: Diagnosis not present

## 2020-08-05 DIAGNOSIS — B078 Other viral warts: Secondary | ICD-10-CM | POA: Diagnosis not present

## 2020-08-05 DIAGNOSIS — D2339 Other benign neoplasm of skin of other parts of face: Secondary | ICD-10-CM | POA: Diagnosis not present

## 2020-08-05 DIAGNOSIS — L578 Other skin changes due to chronic exposure to nonionizing radiation: Secondary | ICD-10-CM | POA: Diagnosis not present

## 2020-09-24 DIAGNOSIS — I10 Essential (primary) hypertension: Secondary | ICD-10-CM | POA: Diagnosis not present

## 2020-11-11 DIAGNOSIS — Z6824 Body mass index (BMI) 24.0-24.9, adult: Secondary | ICD-10-CM | POA: Diagnosis not present

## 2020-11-11 DIAGNOSIS — Z01419 Encounter for gynecological examination (general) (routine) without abnormal findings: Secondary | ICD-10-CM | POA: Diagnosis not present

## 2020-12-02 DIAGNOSIS — Z1211 Encounter for screening for malignant neoplasm of colon: Secondary | ICD-10-CM | POA: Diagnosis not present

## 2020-12-11 LAB — EXTERNAL GENERIC LAB PROCEDURE: COLOGUARD: NEGATIVE

## 2020-12-11 LAB — COLOGUARD: COLOGUARD: NEGATIVE

## 2020-12-25 ENCOUNTER — Encounter: Payer: Self-pay | Admitting: Medical-Surgical

## 2020-12-25 ENCOUNTER — Ambulatory Visit (INDEPENDENT_AMBULATORY_CARE_PROVIDER_SITE_OTHER): Payer: BC Managed Care – PPO

## 2020-12-25 ENCOUNTER — Other Ambulatory Visit: Payer: Self-pay

## 2020-12-25 ENCOUNTER — Ambulatory Visit (INDEPENDENT_AMBULATORY_CARE_PROVIDER_SITE_OTHER): Payer: BC Managed Care – PPO | Admitting: Medical-Surgical

## 2020-12-25 VITALS — BP 123/77 | HR 66 | Temp 98.0°F | Resp 20 | Ht 63.0 in | Wt 142.5 lb

## 2020-12-25 DIAGNOSIS — G4486 Cervicogenic headache: Secondary | ICD-10-CM

## 2020-12-25 DIAGNOSIS — M542 Cervicalgia: Secondary | ICD-10-CM

## 2020-12-25 DIAGNOSIS — E7800 Pure hypercholesterolemia, unspecified: Secondary | ICD-10-CM | POA: Diagnosis not present

## 2020-12-25 DIAGNOSIS — Z7689 Persons encountering health services in other specified circumstances: Secondary | ICD-10-CM

## 2020-12-25 DIAGNOSIS — R030 Elevated blood-pressure reading, without diagnosis of hypertension: Secondary | ICD-10-CM

## 2020-12-25 LAB — CBC: Hemoglobin: 13.2 g/dL (ref 11.7–15.5)
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
## 2020-12-26 LAB — COMPLETE METABOLIC PANEL WITH GFR
AG Ratio: 1.9 (calc) (ref 1.0–2.5)
ALT: 13 U/L (ref 6–29)
AST: 12 U/L (ref 10–35)
Albumin: 4.3 g/dL (ref 3.6–5.1)
Alkaline phosphatase (APISO): 60 U/L (ref 37–153)
BUN: 16 mg/dL (ref 7–25)
CO2: 30 mmol/L (ref 20–32)
Calcium: 9.4 mg/dL (ref 8.6–10.4)
Chloride: 106 mmol/L (ref 98–110)
Creat: 0.74 mg/dL (ref 0.50–1.05)
GFR, Est African American: 108 mL/min/{1.73_m2} (ref >=60)
GFR, Est Non African American: 93 mL/min/{1.73_m2} (ref >=60)
Globulin: 2.3 g/dL (calc) (ref 1.9–3.7)
Glucose, Bld: 90 mg/dL (ref 65–99)
Potassium: 4.1 mmol/L (ref 3.5–5.3)
Sodium: 142 mmol/L (ref 135–146)
Total Bilirubin: 0.5 mg/dL (ref 0.2–1.2)
Total Protein: 6.6 g/dL (ref 6.1–8.1)

## 2020-12-26 LAB — CBC
HCT: 39.7 % (ref 35.0–45.0)
MCH: 30.3 pg (ref 27.0–33.0)
MCHC: 33.2 g/dL (ref 32.0–36.0)
MCV: 91.3 fL (ref 80.0–100.0)
MPV: 11.1 fL (ref 7.5–12.5)
Platelets: 242 10*3/uL (ref 140–400)
RBC: 4.35 10*6/uL (ref 3.80–5.10)
RDW: 12.2 % (ref 11.0–15.0)
WBC: 5.9 10*3/uL (ref 3.8–10.8)

## 2020-12-26 LAB — LIPID PANEL
Cholesterol: 221 mg/dL — ABNORMAL HIGH (ref <200)
HDL: 75 mg/dL (ref >=50)
LDL Cholesterol (Calc): 123 mg/dL (calc) — ABNORMAL HIGH
Non-HDL Cholesterol (Calc): 146 mg/dL (calc) — ABNORMAL HIGH (ref <130)
Total CHOL/HDL Ratio: 2.9 (calc) (ref <5.0)
Triglycerides: 123 mg/dL (ref <150)

## 2021-01-01 ENCOUNTER — Ambulatory Visit: Payer: BC Managed Care – PPO | Admitting: Sports Medicine

## 2021-01-03 ENCOUNTER — Ambulatory Visit: Payer: BC Managed Care – PPO | Admitting: Sports Medicine

## 2021-01-03 ENCOUNTER — Other Ambulatory Visit: Payer: Self-pay

## 2021-01-03 DIAGNOSIS — M5030 Other cervical disc degeneration, unspecified cervical region: Secondary | ICD-10-CM | POA: Diagnosis not present

## 2021-01-03 MED ORDER — MELOXICAM 15 MG PO TABS: ORAL_TABLET | ORAL | 3 refills | Status: DC

## 2021-01-03 MED ORDER — PREDNISONE 50 MG PO TABS: ORAL_TABLET | ORAL | 0 refills | Status: DC

## 2021-01-03 NOTE — Progress Notes (Signed)
    Procedures performed today:    None.  Independent interpretation of notes and tests performed by another provider:   x-rays were personally reviewed and do show C5-C6 retrolisthesis with mild DDD and loss of disc height.  Brief History, Exam, Impression, and Recommendations:    DDD (degenerative disc disease), cervical This is a pleasant 50 year old female, she is had months of neck pain, she is had some chiropractic manipulation, pain is axial in the right side of the neck with radiation into the right periscapular region and over the right deltoid but not past the elbow, minimal weakness. She did get some x-rays done, x-rays were personally reviewed and do show C5-C6 retrolisthesis with mild DDD and loss of disc height. I discussed the anatomy and evolutionary anthropology of disc disease, she will do home physical therapy for 6 weeks, I am going to add 5 days of prednisone followed by a meloxicam as needed. Showed her how to do home traction for the cervical spine. Return to see me in 6 weeks, MRI for interventional planning if no better.    ___________________________________________ Ihor Austin. Benjamin Stain, M.D., ABFM., CAQSM. Primary Care and Sports Medicine [facility]  Adjunct Instructor of Family Medicine  [employer]

## 2021-01-03 NOTE — Assessment & Plan Note (Signed)
This is a pleasant 50 year old female, she is had months of neck pain, she is had some chiropractic manipulation, pain is axial in the right side of the neck with radiation into the right periscapular region and over the right deltoid but not past the elbow, minimal weakness. She did get some x-rays done, x-rays were personally reviewed and do show C5-C6 retrolisthesis with mild DDD and loss of disc height. I discussed the anatomy and evolutionary anthropology of disc disease, she will do home physical therapy for 6 weeks, I am going to add 5 days of prednisone followed by a meloxicam as needed. Showed her how to do home traction for the cervical spine. Return to see me in 6 weeks, MRI for interventional planning if no better.

## 2021-02-13 DIAGNOSIS — H00022 Hordeolum internum right lower eyelid: Secondary | ICD-10-CM | POA: Diagnosis not present

## 2021-02-14 ENCOUNTER — Other Ambulatory Visit: Payer: Self-pay

## 2021-02-14 ENCOUNTER — Ambulatory Visit: Payer: BC Managed Care – PPO | Admitting: Sports Medicine

## 2021-02-14 DIAGNOSIS — M5030 Other cervical disc degeneration, unspecified cervical region: Secondary | ICD-10-CM | POA: Diagnosis not present

## 2021-02-14 NOTE — Assessment & Plan Note (Signed)
Cheryl Navarro returns, she is a very pleasant 50 year old female, months of neck pain, has had some chiropractic manipulation, pain predominantly axial, right-sided neck with radiation to the right periscapular region, over the deltoid but not past the elbow. X-rays did show C5-C6 retrolisthesis with mild DDD and loss of disc height, we treated her conservatively with some physical therapy, 5 days of prednisone, meloxicam as needed, home traction. She continued with some chiropractic manipulation as well and returns today doing well. She is interested in getting back into the gym and back to wakeboarding, I have cleared her for this, return as needed.

## 2021-02-14 NOTE — Progress Notes (Signed)
    Procedures performed today:    None.  Independent interpretation of notes and tests performed by another provider:   None.  Brief History, Exam, Impression, and Recommendations:    DDD (degenerative disc disease), cervical Cheryl Navarro returns, she is a very pleasant 50 year old female, months of neck pain, has had some chiropractic manipulation, pain predominantly axial, right-sided neck with radiation to the right periscapular region, over the deltoid but not past the elbow. X-rays did show C5-C6 retrolisthesis with mild DDD and loss of disc height, we treated her conservatively with some physical therapy, 5 days of prednisone, meloxicam as needed, home traction. She continued with some chiropractic manipulation as well and returns today doing well. She is interested in getting back into the gym and back to wakeboarding, I have cleared her for this, return as needed.    ___________________________________________ Ihor Austin. Benjamin Stain, M.D., ABFM., CAQSM. Primary Care and Sports Medicine [facility]  Adjunct Instructor of Family Medicine  [employer]

## 2021-04-16 DIAGNOSIS — S92254A Nondisplaced fracture of navicular [scaphoid] of right foot, initial encounter for closed fracture: Secondary | ICD-10-CM | POA: Diagnosis not present

## 2021-04-16 DIAGNOSIS — M79671 Pain in right foot: Secondary | ICD-10-CM | POA: Insufficient documentation

## 2021-04-16 DIAGNOSIS — S92253A Displaced fracture of navicular [scaphoid] of unspecified foot, initial encounter for closed fracture: Secondary | ICD-10-CM | POA: Insufficient documentation

## 2021-04-21 DIAGNOSIS — M79671 Pain in right foot: Secondary | ICD-10-CM | POA: Diagnosis not present

## 2021-04-24 DIAGNOSIS — Z1231 Encounter for screening mammogram for malignant neoplasm of breast: Secondary | ICD-10-CM | POA: Diagnosis not present

## 2021-04-24 LAB — HM MAMMOGRAPHY

## 2021-05-05 DIAGNOSIS — S92254A Nondisplaced fracture of navicular [scaphoid] of right foot, initial encounter for closed fracture: Secondary | ICD-10-CM | POA: Diagnosis not present

## 2021-05-28 DIAGNOSIS — S92251D Displaced fracture of navicular [scaphoid] of right foot, subsequent encounter for fracture with routine healing: Secondary | ICD-10-CM | POA: Diagnosis not present

## 2021-07-22 DIAGNOSIS — J029 Acute pharyngitis, unspecified: Secondary | ICD-10-CM | POA: Diagnosis not present

## 2021-10-22 IMAGING — DX DG CERVICAL SPINE COMPLETE 4+V
6 series · 6 of 6 positions shown · non-contrast
Comparison: None.

CLINICAL DATA: Left-sided neck pain, headache

EXAM:
CERVICAL SPINE - COMPLETE 4+ VIEW

[c-spine lat]
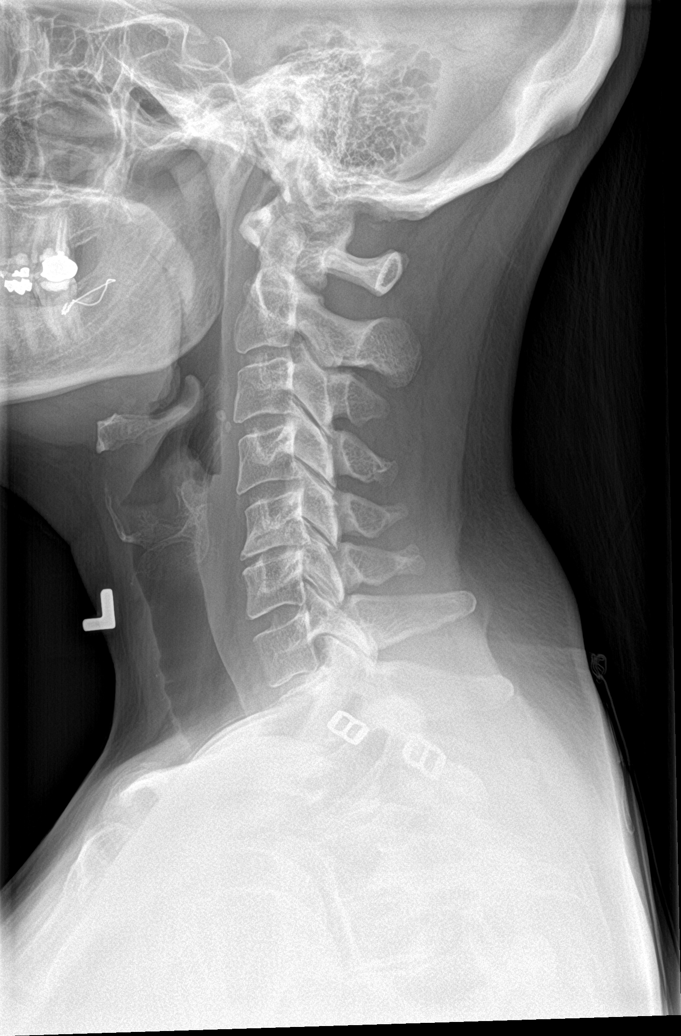

[c-spine obl (1 of 2)]
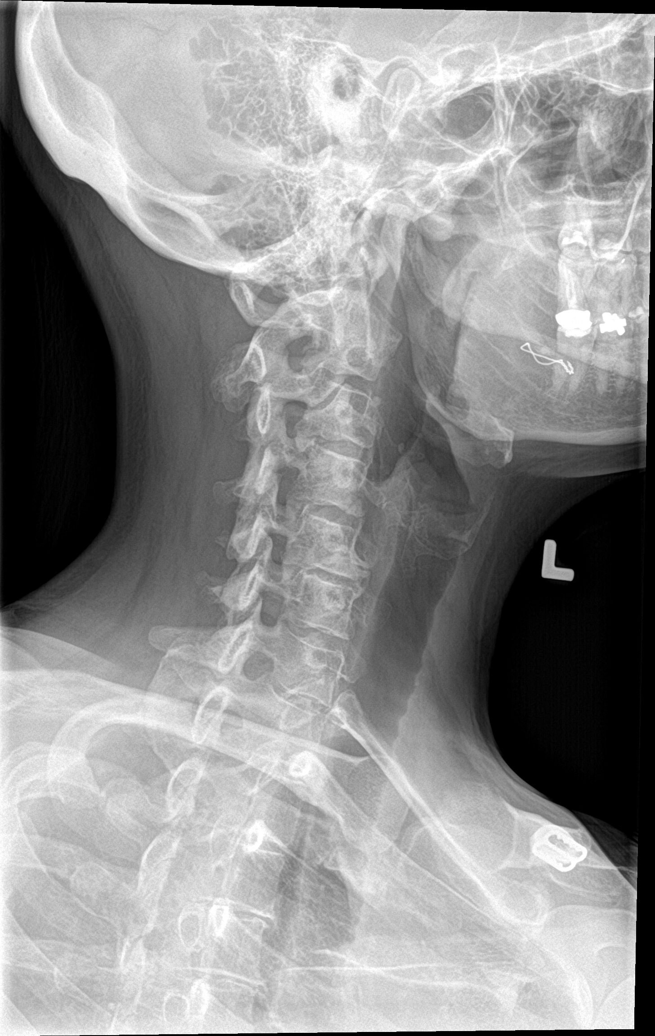

[c-spine obl (2 of 2)]
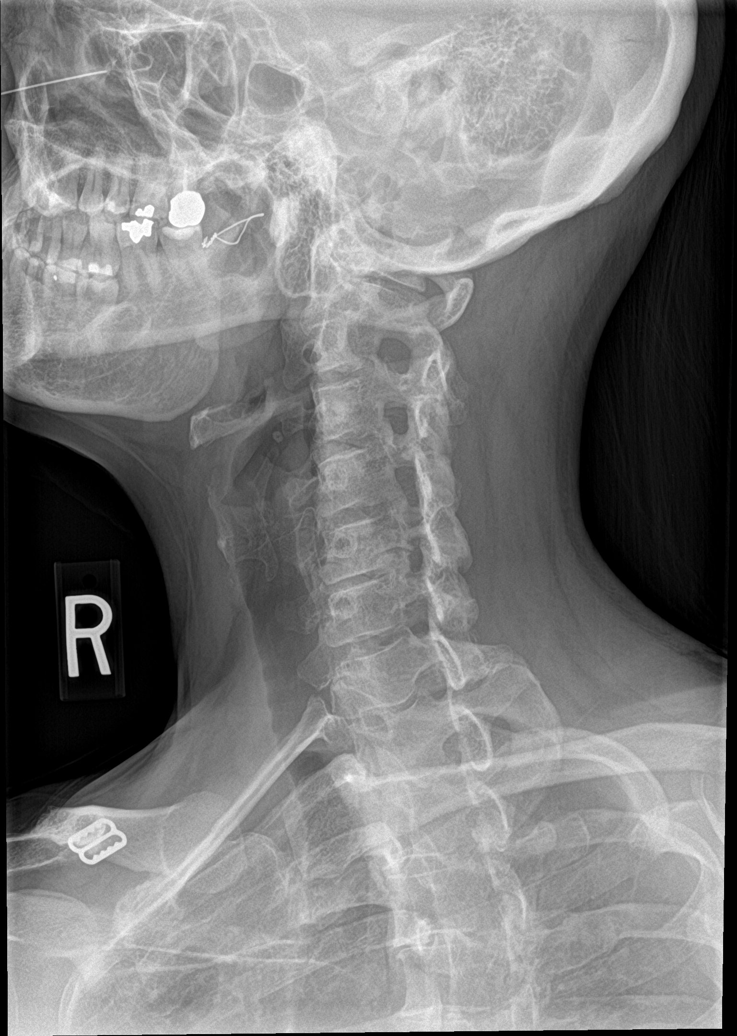

[c-spine ap]
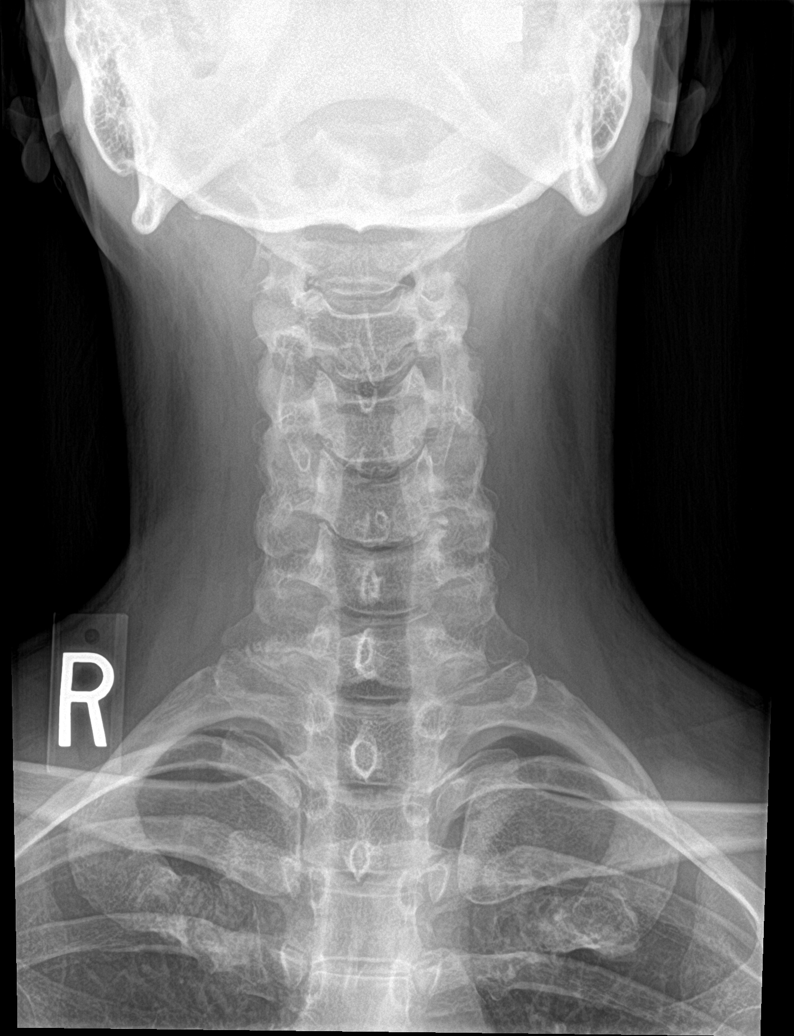

[c-spine open mouth]
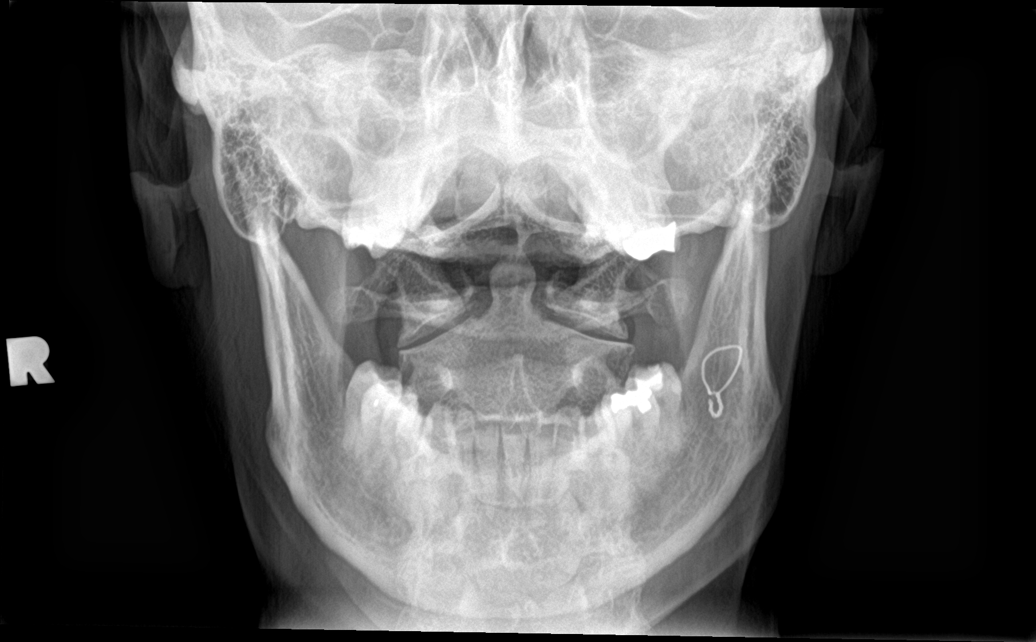

[c-spine swimmers]
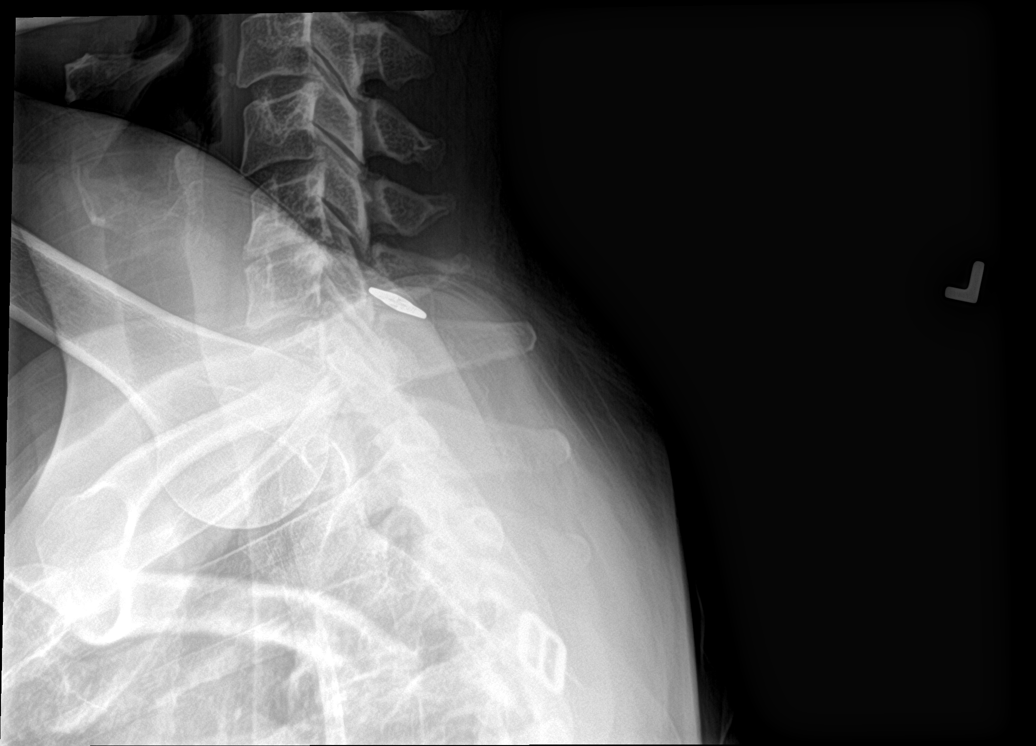

[6 of 6 positions shown; findings below may reference images not displayed]

FINDINGS: Frontal, bilateral oblique, lateral views of the cervical spine are
obtained. There is mild retrolisthesis of C5 relative to C6, likely
due to prominent spondylosis at the C5-6 level. Otherwise alignment
is anatomic. Prominent disc osteophyte complex at C3-4, C4-5, and
C5-6 results in mild symmetrical neural foraminal encroachment. Soft
tissues are unremarkable. Lung apices are clear.
IMPRESSION: 1. Multilevel spondylosis, with symmetrical neural foraminal
encroachment from C3-4 through C5-6.
2. No acute fracture.

## 2021-11-13 DIAGNOSIS — Z6824 Body mass index (BMI) 24.0-24.9, adult: Secondary | ICD-10-CM | POA: Diagnosis not present

## 2021-11-13 DIAGNOSIS — Z01419 Encounter for gynecological examination (general) (routine) without abnormal findings: Secondary | ICD-10-CM | POA: Diagnosis not present

## 2021-12-19 ENCOUNTER — Encounter: Payer: Self-pay | Admitting: Neurology

## 2021-12-19 NOTE — Telephone Encounter (Signed)
Mychart message sent to patient.

## 2022-03-02 DIAGNOSIS — N762 Acute vulvitis: Secondary | ICD-10-CM | POA: Diagnosis not present

## 2022-03-02 DIAGNOSIS — N760 Acute vaginitis: Secondary | ICD-10-CM | POA: Diagnosis not present

## 2022-04-11 ENCOUNTER — Encounter: Payer: Self-pay | Admitting: Medical-Surgical

## 2022-04-14 ENCOUNTER — Encounter: Payer: Self-pay | Admitting: Medical-Surgical

## 2022-04-27 DIAGNOSIS — Z1231 Encounter for screening mammogram for malignant neoplasm of breast: Secondary | ICD-10-CM | POA: Diagnosis not present

## 2022-07-21 DIAGNOSIS — R1110 Vomiting, unspecified: Secondary | ICD-10-CM | POA: Diagnosis not present

## 2022-07-21 DIAGNOSIS — R112 Nausea with vomiting, unspecified: Secondary | ICD-10-CM | POA: Diagnosis not present

## 2022-07-21 DIAGNOSIS — Z9049 Acquired absence of other specified parts of digestive tract: Secondary | ICD-10-CM | POA: Diagnosis not present

## 2022-07-21 DIAGNOSIS — T383X1A Poisoning by insulin and oral hypoglycemic [antidiabetic] drugs, accidental (unintentional), initial encounter: Secondary | ICD-10-CM | POA: Diagnosis not present

## 2022-11-16 DIAGNOSIS — R5383 Other fatigue: Secondary | ICD-10-CM | POA: Diagnosis not present

## 2022-11-16 DIAGNOSIS — Z6825 Body mass index (BMI) 25.0-25.9, adult: Secondary | ICD-10-CM | POA: Diagnosis not present

## 2022-11-16 DIAGNOSIS — Z01419 Encounter for gynecological examination (general) (routine) without abnormal findings: Secondary | ICD-10-CM | POA: Diagnosis not present

## 2022-11-16 DIAGNOSIS — Z124 Encounter for screening for malignant neoplasm of cervix: Secondary | ICD-10-CM | POA: Diagnosis not present

## 2022-12-02 DIAGNOSIS — Z6824 Body mass index (BMI) 24.0-24.9, adult: Secondary | ICD-10-CM | POA: Diagnosis not present

## 2022-12-02 DIAGNOSIS — Z113 Encounter for screening for infections with a predominantly sexual mode of transmission: Secondary | ICD-10-CM | POA: Diagnosis not present

## 2022-12-02 DIAGNOSIS — G4700 Insomnia, unspecified: Secondary | ICD-10-CM | POA: Diagnosis not present

## 2022-12-23 ENCOUNTER — Ambulatory Visit (INDEPENDENT_AMBULATORY_CARE_PROVIDER_SITE_OTHER): Payer: BC Managed Care – PPO

## 2022-12-23 ENCOUNTER — Encounter: Payer: Self-pay | Admitting: Medical-Surgical

## 2022-12-23 ENCOUNTER — Ambulatory Visit: Payer: BC Managed Care – PPO | Admitting: Medical-Surgical

## 2022-12-23 VITALS — BP 123/77 | HR 74 | Resp 20 | Ht 63.0 in | Wt 136.8 lb

## 2022-12-23 DIAGNOSIS — G4486 Cervicogenic headache: Secondary | ICD-10-CM

## 2022-12-23 DIAGNOSIS — R519 Headache, unspecified: Secondary | ICD-10-CM

## 2022-12-23 DIAGNOSIS — N951 Menopausal and female climacteric states: Secondary | ICD-10-CM | POA: Insufficient documentation

## 2022-12-23 DIAGNOSIS — I10 Essential (primary) hypertension: Secondary | ICD-10-CM | POA: Insufficient documentation

## 2022-12-23 DIAGNOSIS — R5382 Chronic fatigue, unspecified: Secondary | ICD-10-CM | POA: Insufficient documentation

## 2022-12-23 DIAGNOSIS — M542 Cervicalgia: Secondary | ICD-10-CM | POA: Diagnosis not present
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
## 2022-12-29 ENCOUNTER — Telehealth: Payer: Self-pay | Admitting: Medical-Surgical

## 2022-12-29 NOTE — Telephone Encounter (Signed)
Patient called she has questions about her MRI that was ordered for her brain she thought it was for her neck [phone]

## 2022-12-29 NOTE — Progress Notes (Signed)
The MRI of the brain images the entire brain including the lower portions at the base of the neck. This should provide imaging of the area of concern.

## 2022-12-30 DIAGNOSIS — F4323 Adjustment disorder with mixed anxiety and depressed mood: Secondary | ICD-10-CM | POA: Diagnosis not present

## 2022-12-30 NOTE — Telephone Encounter (Signed)
Patient had been informed of her concerns regarding MRI in a previous message.

## 2023-01-03 ENCOUNTER — Ambulatory Visit (INDEPENDENT_AMBULATORY_CARE_PROVIDER_SITE_OTHER): Payer: BC Managed Care – PPO

## 2023-01-03 DIAGNOSIS — G4486 Cervicogenic headache: Secondary | ICD-10-CM | POA: Diagnosis not present

## 2023-01-03 DIAGNOSIS — R519 Headache, unspecified: Secondary | ICD-10-CM

## 2023-01-13 MED ORDER — CYCLOBENZAPRINE HCL 10 MG PO TABS: 5.0000 mg | ORAL_TABLET | Freq: Three times a day (TID) | ORAL | 1 refills | Status: DC | PRN

## 2023-02-11 DIAGNOSIS — R202 Paresthesia of skin: Secondary | ICD-10-CM | POA: Diagnosis not present

## 2023-03-24 ENCOUNTER — Telehealth: Payer: Self-pay | Admitting: Family Medicine

## 2023-03-24 NOTE — Telephone Encounter (Signed)
Please please call [facility] and get last Pap smear from 11/16/2022   So that we can abstract into the chart.

## 2023-03-30 NOTE — Telephone Encounter (Signed)
Sent to [facility]

## 2023-04-30 DIAGNOSIS — Z1231 Encounter for screening mammogram for malignant neoplasm of breast: Secondary | ICD-10-CM | POA: Diagnosis not present

## 2023-08-23 DIAGNOSIS — J111 Influenza due to unidentified influenza virus with other respiratory manifestations: Secondary | ICD-10-CM | POA: Diagnosis not present

## 2023-08-23 DIAGNOSIS — J069 Acute upper respiratory infection, unspecified: Secondary | ICD-10-CM | POA: Diagnosis not present

## 2023-10-14 DIAGNOSIS — R519 Headache, unspecified: Secondary | ICD-10-CM | POA: Diagnosis not present

## 2023-10-14 DIAGNOSIS — L309 Dermatitis, unspecified: Secondary | ICD-10-CM | POA: Diagnosis not present

## 2023-10-14 DIAGNOSIS — L03319 Cellulitis of trunk, unspecified: Secondary | ICD-10-CM | POA: Diagnosis not present

## 2023-10-14 DIAGNOSIS — R21 Rash and other nonspecific skin eruption: Secondary | ICD-10-CM | POA: Diagnosis not present

## 2023-10-15 DIAGNOSIS — R21 Rash and other nonspecific skin eruption: Secondary | ICD-10-CM | POA: Diagnosis not present

## 2023-10-15 DIAGNOSIS — R519 Headache, unspecified: Secondary | ICD-10-CM | POA: Diagnosis not present

## 2023-10-15 DIAGNOSIS — L309 Dermatitis, unspecified: Secondary | ICD-10-CM | POA: Diagnosis not present

## 2023-11-07 ENCOUNTER — Emergency Department (HOSPITAL_BASED_OUTPATIENT_CLINIC_OR_DEPARTMENT_OTHER)
Admission: EM | Admit: 2023-11-07 | Discharge: 2023-11-07 | Disposition: A | Discharge status: Home / Self Care | Attending: Emergency Medicine | Admitting: Emergency Medicine

## 2023-11-07 ENCOUNTER — Emergency Department (HOSPITAL_BASED_OUTPATIENT_CLINIC_OR_DEPARTMENT_OTHER)

## 2023-11-07 ENCOUNTER — Emergency Department (HOSPITAL_BASED_OUTPATIENT_CLINIC_OR_DEPARTMENT_OTHER): Admitting: Radiology

## 2023-11-07 ENCOUNTER — Other Ambulatory Visit: Payer: Self-pay

## 2023-11-07 ENCOUNTER — Encounter (HOSPITAL_BASED_OUTPATIENT_CLINIC_OR_DEPARTMENT_OTHER): Payer: Self-pay | Admitting: Emergency Medicine

## 2023-11-07 DIAGNOSIS — I10 Essential (primary) hypertension: Secondary | ICD-10-CM | POA: Insufficient documentation

## 2023-11-07 DIAGNOSIS — H538 Other visual disturbances: Secondary | ICD-10-CM | POA: Insufficient documentation

## 2023-11-07 DIAGNOSIS — R29818 Other symptoms and signs involving the nervous system: Secondary | ICD-10-CM | POA: Diagnosis not present

## 2023-11-07 DIAGNOSIS — R519 Headache, unspecified: Secondary | ICD-10-CM | POA: Diagnosis not present

## 2023-11-07 DIAGNOSIS — R0789 Other chest pain: Secondary | ICD-10-CM | POA: Insufficient documentation

## 2023-11-07 DIAGNOSIS — R42 Dizziness and giddiness: Secondary | ICD-10-CM | POA: Diagnosis not present

## 2023-11-07 DIAGNOSIS — R079 Chest pain, unspecified: Secondary | ICD-10-CM | POA: Diagnosis not present

## 2023-11-07 LAB — BASIC METABOLIC PANEL WITH GFR
Anion gap: 9 (ref 5–15)
BUN: 17 mg/dL (ref 6–20)
CO2: 28 mmol/L (ref 22–32)
Calcium: 9.5 mg/dL (ref 8.9–10.3)
Chloride: 102 mmol/L (ref 98–111)
Creatinine, Ser: 0.9 mg/dL (ref 0.44–1.00)
GFR, Estimated: >60 mL/min (ref >60)
Glucose, Bld: 92 mg/dL (ref 70–99)
Potassium: 3.8 mmol/L (ref 3.5–5.1)
Sodium: 139 mmol/L (ref 135–145)

## 2023-11-07 LAB — CBC
HCT: 38.2 % (ref 36.0–46.0)
Hemoglobin: 12.9 g/dL (ref 12.0–15.0)
MCH: 29.7 pg (ref 26.0–34.0)
MCHC: 33.8 g/dL (ref 30.0–36.0)
MCV: 88 fL (ref 80.0–100.0)
Platelets: 305 10*3/uL (ref 150–400)
RBC: 4.34 MIL/uL (ref 3.87–5.11)
RDW: 12 % (ref 11.5–15.5)
WBC: 5.4 10*3/uL (ref 4.0–10.5)
nRBC: 0 % (ref 0.0–0.2)

## 2023-11-07 LAB — TROPONIN I (HIGH SENSITIVITY): Troponin I (High Sensitivity): 3 ng/L (ref <18)

## 2023-11-07 NOTE — ED Provider Notes (Signed)
 [place] EMERGENCY DEPARTMENT AT [facility] Provider Note   CSN: [id number] Arrival date & time: 11/07/23  1548     History {Add pertinent medical, surgical, social history, OB history to HPI:1} Chief Complaint  Patient presents with   Chest Pain    Cheryl Navarro is a 53 y.o. female with PMH as listed below who presents via pov from home with CP today; states that yesterday she had some vision issues and chest pressure. States vision problems resolved yesterday; but she does feel like she has "wavy lines" in her field of vision.  Happened earlier today and has since gone away.  She also had some blurry vision that with bilateral yesterday but did not have the wavy lines yesterday.  No history of ocular surgery or complications, does wear contacts.  Does have chronic cervicogenic headaches for which she has had an MRI in the past which was normal and had complained of blurry vision with headaches at that time, but she states this blurry vision is much different than that.  She denies any floaters or flashers, black vision, ocular pain, or ocular trauma.  No lightheadedness or passing out.  Chest pain is intermittent, nonradiating, feels like pressure, worse with movement/exertion, no associated nausea vomiting shortness of breath.  Has had a dry cough for a couple of weeks.  No history of DVT or PE, no leg swelling, no recent travel surgeries or hospitalizations.    Past Medical History:  Diagnosis Date   Amnesia    Chest pain    Family history of heart disease    HTN (hypertension)    no meds, denies HTN now   Hyperlipidemia    no longer on meds after diet changes   Skin infection 11/2014   arm swollen and infected following breast Biopsy per pt.       Home Medications Prior to Admission medications   Medication Sig Start Date End Date Taking? Authorizing Provider  cyclobenzaprine (FLEXERIL) 10 MG tablet Take 0.5-1 tablets (5-10 mg total) by mouth 3 (three) times daily  as needed for muscle spasms. Caution: can cause drowsiness 01/13/23   Cherre Cornish, NP      Allergies    Patient has no known allergies.    Review of Systems   Review of Systems A 10 point review of systems was performed and is negative unless otherwise reported in HPI.  Physical Exam Updated Vital Signs BP (!) 145/89 (BP Location: Right Arm)   Pulse 91   Temp 98.1 F (36.7 C) (Oral)   Resp 19   Ht 5\' 3"  (1.6 m)   Wt 62.1 kg   SpO2 100%   BMI 24.25 kg/m  Physical Exam General: Normal appearing female, lying in bed.  HEENT: PERRLA, EOMI, no nystagmus, Sclera anicteric, MMM, trachea midline. Face symmetric.  Cardiology: RRR, no murmurs/rubs/gallops. BL radial and DP pulses equal bilaterally.  Resp: Normal respiratory rate and effort. CTAB, no wheezes, rhonchi, crackles.  Abd: Soft, non-tender, non-distended. No rebound tenderness or guarding.  GU: Deferred. MSK: No peripheral edema or signs of trauma. Extremities without deformity or TTP. No cyanosis or clubbing. Skin: warm, dry. No rashes or lesions. Back: No CVA tenderness Neuro: A&Ox4, CNs II-XII grossly intact. 5/5 strength all extremities. Sensation grossly intact. Normal speech. Psych: Normal mood and affect.   ED Results / Procedures / Treatments   Labs (all labs ordered are listed, but only abnormal results are displayed) Labs Reviewed  CBC  BASIC METABOLIC PANEL WITH GFR  PREGNANCY, URINE  TROPONIN I (HIGH SENSITIVITY)    EKG None  Radiology DG Chest 2 View Result Date: 11/07/2023 CLINICAL DATA:  Chest pain EXAM: CHEST - 2 VIEW COMPARISON:  Chest x-ray 08/27/2014 FINDINGS: The heart size and mediastinal contours are within normal limits. Both lungs are clear. The visualized skeletal structures are unremarkable. IMPRESSION: No active cardiopulmonary disease. Electronically Signed   By: Tyron Gallon M.D.   On: 11/07/2023 17:05    Procedures Procedures  {Document cardiac monitor, telemetry assessment  procedure when appropriate:1} EMERGENCY DEPARTMENT US  OCULAR EXAM "Study: Limited Ultrasound of Orbit "  INDICATIONS: Vision loss and wavy lines/blurred vision Linear probe utilized to obtain images in both long and short axis of the orbit having the patient look left and right if possible.  PERFORMED BY: Myself IMAGES ARCHIVED?: No LIMITATIONS: none VIEWS USED: Left orbit INTERPRETATION: No retinal detachment, Lens in proper position   Medications Ordered in ED Medications - No data to display  ED Course/ Medical Decision Making/ A&P                          Medical Decision Making Amount and/or Complexity of Data Reviewed Labs: ordered. Radiology: ordered.    This patient presents to the ED for concern of ***, this involves an extensive number of treatment options, and is a complaint that carries with it a high risk of complications and morbidity.  I considered the following differential and admission for this acute, potentially life threatening condition.   MDM:    ***     Labs: I Ordered, and personally interpreted labs.  The pertinent results include:  those listed above  Imaging Studies ordered: I ordered imaging studies including CTH, CXR I independently visualized and interpreted imaging. I agree with the radiologist interpretation  Additional history obtained from chart review.    Cardiac Monitoring: The patient was maintained on a cardiac monitor.  I personally viewed and interpreted the cardiac monitored which showed an underlying rhythm of: nsr  Reevaluation: After the interventions noted above, I reevaluated the patient and found that they have :improved  Social Determinants of Health: Lives independently  Disposition:  ***  Co morbidities that complicate the patient evaluation  Past Medical History:  Diagnosis Date   Amnesia    Chest pain    Family history of heart disease    HTN (hypertension)    no meds, denies HTN now   Hyperlipidemia     no longer on meds after diet changes   Skin infection 11/2014   arm swollen and infected following breast Biopsy per pt.     Medicines No orders of the defined types were placed in this encounter.   I have reviewed the patients home medicines and have made adjustments as needed  Problem List / ED Course: Problem List Items Addressed This Visit   None        {Document critical care time when appropriate:1} {Document review of labs and clinical decision tools ie heart score, Chads2Vasc2 etc:1}  {Document your independent review of radiology images, and any outside records:1} {Document your discussion with family members, caretakers, and with consultants:1} {Document social determinants of health affecting pt's care:1} {Document your decision making why or why not admission, treatments were needed:1}  This note was created using dictation software, which may contain spelling or grammatical errors.

## 2023-11-07 NOTE — Discharge Instructions (Signed)
 Thank you for coming to [facility] Emergency Department. You were seen for chest pain and visual disturbance in left eye. We did an exam, labs, and imaging, and these showed no acute findings.   Please follow up with your primary care provider within 1 week about your chest pain. You can alternate tylenol and ibuprofen at home.   Your visual distortion in your left eye, please follow-up with ophthalmology tomorrow.  Dr. Elnita Hai' office will call you in the morning to make an appointment. If you don't hear from the ophthalmologist in the morning, please give [facility] at [phone] and say that Dr. Elnita Hai asked you to call for an appointment.  We have written you a work note.  Do not hesitate to return to the ED or call 911 if you experience: -Worsening symptoms -Lightheadedness, passing out -Fevers/chills -Anything else that concerns you

## 2023-11-07 NOTE — ED Triage Notes (Signed)
 Pt via pov from home with cp today; states that yesterday she had some vision issues and pressure. States vision problems resolved yesterday; but she does feel like she has "wavy lines" in her field of vision, starting 1 hour PTA. PT alert & oriented, NAD noted. No drift in arms or legs, smile symmetrical; speech WNL;

## 2024-02-19 DIAGNOSIS — Z1211 Encounter for screening for malignant neoplasm of colon: Secondary | ICD-10-CM | POA: Diagnosis not present

## 2024-02-24 LAB — COLOGUARD: COLOGUARD: NEGATIVE

## 2024-03-20 ENCOUNTER — Encounter: Payer: Self-pay | Admitting: Urgent Care

## 2024-03-20 ENCOUNTER — Ambulatory Visit: Admitting: Urgent Care

## 2024-03-20 VITALS — BP 126/75 | HR 69 | Resp 16 | Ht 63.0 in | Wt 139.2 lb

## 2024-03-20 DIAGNOSIS — R1013 Epigastric pain: Secondary | ICD-10-CM

## 2024-03-20 DIAGNOSIS — R140 Abdominal distension (gaseous): Secondary | ICD-10-CM | POA: Diagnosis not present

## 2024-03-20 MED ORDER — OMEPRAZOLE 20 MG PO CPDR: 20.0000 mg | DELAYED_RELEASE_CAPSULE | Freq: Two times a day (BID) | ORAL | 0 refills | Status: AC
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
## 2024-03-20 NOTE — Patient Instructions (Signed)
 Please start taking omeprazole  20mg  twice daily, at least 30 min before eating. We will contact you with results for your tests on Mychart.

## 2024-03-21 ENCOUNTER — Ambulatory Visit: Payer: Self-pay | Admitting: Urgent Care

## 2024-03-22 LAB — COMPREHENSIVE METABOLIC PANEL WITH GFR
ALT: 15 IU/L (ref 0–32)
AST: 16 IU/L (ref 0–40)
Albumin: 4.5 g/dL (ref 3.8–4.9)
Alkaline Phosphatase: 78 IU/L (ref 44–121)
BUN/Creatinine Ratio: 17 (ref 9–23)
BUN: 14 mg/dL (ref 6–24)
Bilirubin Total: 0.6 mg/dL (ref 0.0–1.2)
CO2: 25 mmol/L (ref 20–29)
Calcium: 9.9 mg/dL (ref 8.7–10.2)
Chloride: 102 mmol/L (ref 96–106)
Creatinine, Ser: 0.81 mg/dL (ref 0.57–1.00)
Globulin, Total: 2.4 g/dL (ref 1.5–4.5)
Glucose: 86 mg/dL (ref 70–99)
Potassium: 3.8 mmol/L (ref 3.5–5.2)
Sodium: 141 mmol/L (ref 134–144)
Total Protein: 6.9 g/dL (ref 6.0–8.5)
eGFR: 86 mL/min/1.73 (ref >59)

## 2024-03-22 LAB — CBC WITH DIFFERENTIAL/PLATELET
Basophils Absolute: 0.1 x10E3/uL (ref 0.0–0.2)
Basos: 1 %
EOS (ABSOLUTE): 1 x10E3/uL — ABNORMAL HIGH (ref 0.0–0.4)
Eos: 10 %
Hematocrit: 40.8 % (ref 34.0–46.6)
Hemoglobin: 13.2 g/dL (ref 11.1–15.9)
Immature Grans (Abs): 0 x10E3/uL (ref 0.0–0.1)
Immature Granulocytes: 0 %
Lymphocytes Absolute: 3.3 x10E3/uL — ABNORMAL HIGH (ref 0.7–3.1)
Lymphs: 34 %
MCH: 29.7 pg (ref 26.6–33.0)
MCHC: 32.4 g/dL (ref 31.5–35.7)
MCV: 92 fL (ref 79–97)
Monocytes Absolute: 0.7 x10E3/uL (ref 0.1–0.9)
Monocytes: 7 %
Neutrophils Absolute: 4.6 x10E3/uL (ref 1.4–7.0)
Neutrophils: 48 %
Platelets: 277 x10E3/uL (ref 150–450)
RBC: 4.45 x10E6/uL (ref 3.77–5.28)
RDW: 12.6 % (ref 11.7–15.4)
WBC: 9.7 x10E3/uL (ref 3.4–10.8)

## 2024-03-22 LAB — LIPASE: Lipase: 42 U/L (ref 14–72)

## 2024-03-22 LAB — H. PYLORI BREATH TEST

## 2024-03-27 DIAGNOSIS — H1032 Unspecified acute conjunctivitis, left eye: Secondary | ICD-10-CM | POA: Diagnosis not present

## 2024-03-27 DIAGNOSIS — S0592XA Unspecified injury of left eye and orbit, initial encounter: Secondary | ICD-10-CM | POA: Diagnosis not present

## 2024-04-11 ENCOUNTER — Other Ambulatory Visit: Payer: Self-pay | Admitting: Urgent Care

## 2024-04-11 DIAGNOSIS — R1013 Epigastric pain: Secondary | ICD-10-CM

## 2024-04-11 DIAGNOSIS — R140 Abdominal distension (gaseous): Secondary | ICD-10-CM

## 2024-05-15 DIAGNOSIS — Z1231 Encounter for screening mammogram for malignant neoplasm of breast: Secondary | ICD-10-CM | POA: Diagnosis not present

## 2024-05-15 DIAGNOSIS — Z0000 Encounter for general adult medical examination without abnormal findings: Secondary | ICD-10-CM | POA: Diagnosis not present

## 2024-05-15 DIAGNOSIS — Z01419 Encounter for gynecological examination (general) (routine) without abnormal findings: Secondary | ICD-10-CM | POA: Diagnosis not present

## 2024-06-07 DIAGNOSIS — B349 Viral infection, unspecified: Secondary | ICD-10-CM | POA: Diagnosis not present

## 2024-07-13 ENCOUNTER — Encounter: Payer: Self-pay | Admitting: Medical-Surgical

## 2024-07-13 ENCOUNTER — Telehealth: Payer: Self-pay

## 2024-07-13 NOTE — Telephone Encounter (Signed)
 I sent a medical record request to Dr Rox for recent mammogram and PAP.
